# Patient Record
Sex: Female | Born: 1965 | Race: Black or African American | Hispanic: No | Marital: Single | Smoking: Never smoker
Health system: Southern US, Community
[De-identification: ages and names within clinical notes are randomized; demographics above are authoritative.]

## PROBLEM LIST (undated history)

## (undated) DIAGNOSIS — I10 Essential (primary) hypertension: Secondary | ICD-10-CM

## (undated) DIAGNOSIS — E7800 Pure hypercholesterolemia, unspecified: Secondary | ICD-10-CM

## (undated) HISTORY — PX: BREAST SURGERY: SHX581

## (undated) HISTORY — PX: BUNIONECTOMY: SHX129

## (undated) HISTORY — PX: OTHER SURGICAL HISTORY: SHX169

## (undated) HISTORY — PX: HYSTEROSCOPY: SHX211

## (undated) HISTORY — DX: Pure hypercholesterolemia, unspecified: E78.00

## (undated) HISTORY — PX: DILATION AND CURETTAGE OF UTERUS: SHX78

## (undated) HISTORY — DX: Essential (primary) hypertension: I10

---

## 1994-08-27 HISTORY — PX: BREAST EXCISIONAL BIOPSY: SUR124

## 1998-10-20 ENCOUNTER — Other Ambulatory Visit: Admission: RE | Admit: 1998-10-20 | Discharge: 1998-10-20 | Payer: Self-pay | Admitting: Gynecology

## 1999-06-16 ENCOUNTER — Encounter: Payer: Self-pay | Admitting: Emergency Medicine

## 1999-06-16 ENCOUNTER — Emergency Department (HOSPITAL_COMMUNITY): Admission: EM | Admit: 1999-06-16 | Discharge: 1999-06-16 | Payer: Self-pay | Admitting: Emergency Medicine

## 1999-11-15 ENCOUNTER — Other Ambulatory Visit: Admission: RE | Admit: 1999-11-15 | Discharge: 1999-11-15 | Payer: Self-pay | Admitting: Gynecology

## 2000-09-11 ENCOUNTER — Encounter (INDEPENDENT_AMBULATORY_CARE_PROVIDER_SITE_OTHER): Payer: Self-pay | Admitting: Specialist

## 2000-09-11 ENCOUNTER — Ambulatory Visit (HOSPITAL_BASED_OUTPATIENT_CLINIC_OR_DEPARTMENT_OTHER): Admission: RE | Admit: 2000-09-11 | Discharge: 2000-09-11 | Payer: Self-pay | Admitting: Surgery

## 2000-10-02 ENCOUNTER — Encounter: Admission: RE | Admit: 2000-10-02 | Discharge: 2000-10-02 | Payer: Self-pay | Admitting: Surgery

## 2000-10-02 ENCOUNTER — Encounter: Payer: Self-pay | Admitting: Surgery

## 2000-10-31 ENCOUNTER — Encounter (INDEPENDENT_AMBULATORY_CARE_PROVIDER_SITE_OTHER): Payer: Self-pay

## 2000-10-31 ENCOUNTER — Ambulatory Visit (HOSPITAL_COMMUNITY): Admission: RE | Admit: 2000-10-31 | Discharge: 2000-10-31 | Payer: Self-pay | Admitting: Gynecology

## 2000-11-11 ENCOUNTER — Encounter: Admission: RE | Admit: 2000-11-11 | Discharge: 2001-02-09 | Payer: Self-pay | Admitting: Internal Medicine

## 2000-11-14 ENCOUNTER — Other Ambulatory Visit: Admission: RE | Admit: 2000-11-14 | Discharge: 2000-11-14 | Payer: Self-pay | Admitting: Gynecology

## 2001-12-29 ENCOUNTER — Encounter: Admission: RE | Admit: 2001-12-29 | Discharge: 2001-12-29 | Payer: Self-pay | Admitting: Surgery

## 2001-12-29 ENCOUNTER — Encounter: Payer: Self-pay | Admitting: Surgery

## 2001-12-31 ENCOUNTER — Other Ambulatory Visit: Admission: RE | Admit: 2001-12-31 | Discharge: 2001-12-31 | Payer: Self-pay | Admitting: Gynecology

## 2003-02-02 ENCOUNTER — Encounter: Payer: Self-pay | Admitting: Internal Medicine

## 2003-02-02 ENCOUNTER — Encounter: Admission: RE | Admit: 2003-02-02 | Discharge: 2003-02-02 | Payer: Self-pay | Admitting: Internal Medicine

## 2003-03-03 ENCOUNTER — Encounter: Admission: RE | Admit: 2003-03-03 | Discharge: 2003-03-03 | Payer: Self-pay | Admitting: Surgery

## 2003-03-03 ENCOUNTER — Encounter: Payer: Self-pay | Admitting: Surgery

## 2003-03-17 ENCOUNTER — Other Ambulatory Visit: Admission: RE | Admit: 2003-03-17 | Discharge: 2003-03-17 | Payer: Self-pay | Admitting: Gynecology

## 2004-03-08 ENCOUNTER — Encounter: Admission: RE | Admit: 2004-03-08 | Discharge: 2004-03-08 | Payer: Self-pay | Admitting: Surgery

## 2004-11-22 ENCOUNTER — Other Ambulatory Visit: Admission: RE | Admit: 2004-11-22 | Discharge: 2004-11-22 | Payer: Self-pay | Admitting: Gynecology

## 2005-02-06 ENCOUNTER — Ambulatory Visit (HOSPITAL_COMMUNITY): Admission: RE | Admit: 2005-02-06 | Discharge: 2005-02-06 | Payer: Self-pay | Admitting: Orthopaedic Surgery

## 2005-02-06 ENCOUNTER — Ambulatory Visit (HOSPITAL_BASED_OUTPATIENT_CLINIC_OR_DEPARTMENT_OTHER): Admission: RE | Admit: 2005-02-06 | Discharge: 2005-02-06 | Payer: Self-pay | Admitting: Orthopaedic Surgery

## 2005-07-13 ENCOUNTER — Encounter: Admission: RE | Admit: 2005-07-13 | Discharge: 2005-07-13 | Payer: Self-pay | Admitting: Gynecology

## 2005-12-10 ENCOUNTER — Other Ambulatory Visit: Admission: RE | Admit: 2005-12-10 | Discharge: 2005-12-10 | Payer: Self-pay | Admitting: Gynecology

## 2006-06-17 ENCOUNTER — Other Ambulatory Visit: Admission: RE | Admit: 2006-06-17 | Discharge: 2006-06-17 | Payer: Self-pay | Admitting: Gynecology

## 2006-08-01 ENCOUNTER — Encounter: Admission: RE | Admit: 2006-08-01 | Discharge: 2006-08-01 | Payer: Self-pay | Admitting: Gynecology

## 2006-12-12 ENCOUNTER — Other Ambulatory Visit: Admission: RE | Admit: 2006-12-12 | Discharge: 2006-12-12 | Payer: Self-pay | Admitting: Gynecology

## 2007-09-24 ENCOUNTER — Encounter: Admission: RE | Admit: 2007-09-24 | Discharge: 2007-09-24 | Payer: Self-pay | Admitting: Gynecology

## 2008-01-14 ENCOUNTER — Other Ambulatory Visit: Admission: RE | Admit: 2008-01-14 | Discharge: 2008-01-14 | Payer: Self-pay | Admitting: Gynecology

## 2008-11-10 ENCOUNTER — Encounter: Admission: RE | Admit: 2008-11-10 | Discharge: 2008-11-10 | Payer: Self-pay | Admitting: Gynecology

## 2009-01-14 ENCOUNTER — Ambulatory Visit: Payer: Self-pay | Admitting: Gynecology

## 2009-01-14 ENCOUNTER — Other Ambulatory Visit: Admission: RE | Admit: 2009-01-14 | Discharge: 2009-01-14 | Payer: Self-pay | Admitting: Gynecology

## 2009-01-14 ENCOUNTER — Encounter: Payer: Self-pay | Admitting: Gynecology

## 2009-12-15 ENCOUNTER — Encounter: Admission: RE | Admit: 2009-12-15 | Discharge: 2009-12-15 | Payer: Self-pay | Admitting: Gynecology

## 2010-07-19 ENCOUNTER — Other Ambulatory Visit: Admission: RE | Admit: 2010-07-19 | Discharge: 2010-07-19 | Payer: Self-pay | Admitting: Gynecology

## 2010-07-19 ENCOUNTER — Ambulatory Visit: Payer: Self-pay | Admitting: Gynecology
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
## 2011-01-12 NOTE — H&P (Signed)
[facility]  Patient:    Mercedes Morton, Mercedes Morton                         MRN: [id number] Adm. Date:  10/31/00 Attending:  Marcial Pacas P. Fontaine, M.D.                         History and Physical  CHIEF COMPLAINT:              Irregular bleeding.  HISTORY OF PRESENT ILLNESS:   A 45 year old G0 female with history of irregular vaginal bleeding since November.  Patient has a history of irregular cycles in the past consistent with an ovulatory dysfunction and patient was evaluated with ultrasound and tentative endometrial sampling beginning a Provera withdrawal plan.  Patient was found at the time of her sonohysterogram, to have a diffusely thickened endometrial wall, irregular in appearance with tiny cystic foci throughout the posterior uterine lining.  A biopsy was postponed at that time feeling that she needed a more complete sampling to include D&C, as well as visual inspection to rule out true polyp formation.  Patient is admitted at this time for hysteroscopy/D&C.  PAST MEDICAL HISTORY:         Significant for high blood pressure.  PAST SURGICAL HISTORY:        Breast biopsy.  ALLERGIES:                    None.  CURRENT MEDICATIONS:          Zestoretic, vitamins, calcium.  REVIEW OF SYSTEMS:            Noncontributory.  FAMILY HISTORY:               Noncontributory.  SOCIAL HISTORY:               Without cigarette or alcohol use.  ADMISSION PHYSICAL EXAMINATION:  VITAL SIGNS:                  Per anesthesia intake.  GENERAL:                      Per anesthesia intake.  PELVIC:                       External, BUS, vagina normal.  Cervix grossly normal.  Uterus overall normal to bimanual although limited by abdominal girth.  Adnexa without gross masses or tenderness.  ASSESSMENT:                   A 45 year old G0, history of oligo-ovulatory cycles most recently with bleeding on and off with ultrasound suggestive of a thickened endometrium, rule  out hyperplasia for hysteroscopy/D&C.  I reviewed the issues of hysteroscopy/D&C to include the procedure in detail with the patient.  I discussed the risks to include bleeding, transfusion, infection, uterine perforation, damage to internal organs necessitating major exploratory ______ surgeries, and future ______ surgeries all of which were understood. The patients questions were answered to her satisfaction and she is ready to proceed with surgery. DD:  10/28/00 TD:  10/28/00 Job: [id number]

## 2011-01-12 NOTE — Op Note (Signed)
Mercedes Morton, Mercedes Morton                ACCOUNT NO.:  [id number]   MEDICAL RECORD NO.:  [id number]          PATIENT TYPE:  AMB   LOCATION:  DSC                          FACILITY:  [facility]   PHYSICIAN:  Lubertha Basque. Dalldorf, M.D.DATE OF BIRTH:  01-09-1966   DATE OF PROCEDURE:  02/06/2005  DATE OF DISCHARGE:                                 OPERATIVE REPORT   PREOPERATIVE DIAGNOSIS:  Right foot hallux valgus.   POSTOPERATIVE DIAGNOSIS:  Right foot hallux valgus.   PROCEDURE:  Right foot chevron osteotomy.   ANESTHESIA:  General.   ATTENDING SURGEON:  Lubertha Basque. Jerl Santos, M.D.   ASSISTANT:  Lindwood Qua, P.A.   INDICATIONS FOR PROCEDURE:  The patient is a 45 year old woman with long  history of painful bunions. We have been following her for about three years  and have tried various shoe modifications and anti-inflammatories.  Unfortunately, she persists with pain and difficulty finding the acceptable  shoe wear. She is offered an operation to correct this problem. Bunionectomy  with chevron osteotomy was described. Informed operative consent was  obtained after discussing possible complications of reaction to anesthesia,  infection, neurovascular injury.   DESCRIPTION OF PROCEDURE:  The patient was taken to the operating suite  where general anesthetic was applied without difficulty. She was positioned  supine and prepped and draped in normal sterile fashion. After  administration of preop IV antibiotics, the right leg was elevated,  exsanguinated, tourniquet inflated about her calf. A dorsomedial incision  was made over the first MTP joint with dissection down to the capsule. A V-Y  incision was made through the capsule to expose a very prominent bunion.  This was excised parallel with the medial border of the foot with an  oscillating saw. I then performed a chevron osteotomy cut in the metatarsal  head and shifted her distal portion in a lateral direction about 7 or 8 mm.  This was  then pinned with an Orthosorb pin. We then used a saw to cut off  the consequent prominence at the osteotomy site. Fluoroscopy was used to  confirm adequate correction of her deformity and I read these views myself  to make appropriate intraoperative decisions.  I then made an incision in  the first interspace and released the abductor at this area. The tourniquet  was deflated and a small amount bleeding was easily controlled Bovie  cautery. The toe became pink and warm and she had good refill of the tip.  The wound was irrigated followed by reapproximation of capsular tissues with  Vicryl. Skin was then reapproximated with nylon at both incisions.  Some  Marcaine was injected about the incision site followed by Adaptic and a dry  gauze dressing with a loose Ace wrap. Estimated blood loss and  intraoperative fluids can be obtained from anesthesia records as can  accurate tourniquet time.   DISPOSITION:  The patient was extubated in the operating room taken to  recovery in stable addition. Plans were for her to go home the same day and  follow up in the office in less than a week.  I  will contact by phone  tonight.       PGD/MEDQ  D:  02/06/2005  T:  02/06/2005  Job:  [id number]

## 2011-01-12 NOTE — Op Note (Signed)
[facility]  Patient:    Mercedes Morton, Mercedes Morton                         MRN: [id number] Proc. Date: 10/31/00 Attending:  Marcial Pacas P. Fontaine, M.D.                           Operative Report  PREOPERATIVE DIAGNOSIS:       Irregular bleeding, rule out polyps.  POSTOPERATIVE DIAGNOSIS:      Irregular bleeding, rule out polyps.  OPERATION:                    Hysteroscopy dilatation and curettage.  SURGEON:                      Timothy P. Fontaine, M.D.  ANESTHESIA:                   General.  COMPLICATIONS:                None.  ESTIMATED BLOOD LOSS:         Minimal  SORBITOL DISCREPANCY:         Minimal.  SPECIMENS:                    1. Endometrial polyps.                               2. Endometrial curetting.  FINDINGS:                     Endometrial cavity with undulations of polypoid endometrium.  No distinctive individual polyps identified.  Hysteroscopy otherwise normal.  DESCRIPTION OF PROCEDURE:     The patient was taken to the operating room and underwent anesthesia.  She was placed in the low dorsolithotomy position, received a vaginal perineal preparation with Betadine solution and then Foley catheterization by the nursing personnel.  The patient was draped in the usual fashion.  The cervix was visualized and the anterior lip of the cervix grasped with a single-tooth tenaculum.  The cervix was gently dilated to admit the diagnostic hysteroscope, hysteroscopy was performed with findings of multiple polypoid type endometrial projections, although no true polyps seen.  The first pass was made with polyp forceps.  Multiple passes removing multiple polypoid type fragments, and then subsequently a sharp curettage was performed.  The specimens were sent separately.  Repeat hysteroscopy showed good distension and empty cavity without remaining growths.  The instruments were then removed.  Adequate hemostasis was visualized.  The patient  was placed in the supine position and awakened without difficulty and taken to the recovery room in good condition. DD:  10/31/00 TD:  10/31/00 Job: [id number]

## 2011-02-08 ENCOUNTER — Other Ambulatory Visit: Payer: Self-pay | Admitting: Gynecology

## 2011-02-08 DIAGNOSIS — Z1231 Encounter for screening mammogram for malignant neoplasm of breast: Secondary | ICD-10-CM

## 2011-02-16 ENCOUNTER — Ambulatory Visit
Admission: RE | Admit: 2011-02-16 | Discharge: 2011-02-16 | Disposition: A | Discharge status: Home / Self Care | Payer: BC Managed Care – PPO | Source: Ambulatory Visit | Attending: Gynecology | Admitting: Gynecology

## 2011-02-16 DIAGNOSIS — Z1231 Encounter for screening mammogram for malignant neoplasm of breast: Secondary | ICD-10-CM

## 2011-08-07 ENCOUNTER — Encounter: Payer: Self-pay | Admitting: Gynecology

## 2011-08-07 DIAGNOSIS — I10 Essential (primary) hypertension: Secondary | ICD-10-CM | POA: Insufficient documentation

## 2011-08-07 DIAGNOSIS — N8500 Endometrial hyperplasia, unspecified: Secondary | ICD-10-CM | POA: Insufficient documentation

## 2011-08-13 ENCOUNTER — Encounter: Payer: Self-pay | Admitting: Gynecology

## 2011-08-13 ENCOUNTER — Ambulatory Visit (INDEPENDENT_AMBULATORY_CARE_PROVIDER_SITE_OTHER): Payer: BC Managed Care – PPO | Admitting: Gynecology

## 2011-08-13 VITALS — BP 130/78 | Ht 70.0 in | Wt 308.0 lb

## 2011-08-13 DIAGNOSIS — E7800 Pure hypercholesterolemia, unspecified: Secondary | ICD-10-CM | POA: Insufficient documentation

## 2011-08-13 DIAGNOSIS — Z01419 Encounter for gynecological examination (general) (routine) without abnormal findings: Secondary | ICD-10-CM

## 2011-08-13 DIAGNOSIS — Z23 Encounter for immunization: Secondary | ICD-10-CM

## 2011-08-13 NOTE — Progress Notes (Signed)
Mercedes Morton 1965-11-12 [id number]        45 y.o.  for annual exam.  Doing well with regular menses. Vasectomy birth control.  Past medical history,surgical history, medications, allergies, family history and social history were all reviewed and documented in the EPIC chart. ROS:  Was performed and pertinent positives and negatives are included in the history.  Exam: chaperone present Filed Vitals:   08/13/11 1515  BP: 130/78   General appearance  Normal Skin grossly normal Head/Neck normal with no cervical or supraclavicular adenopathy thyroid normal Lungs  clear Cardiac RR, without RMG Abdominal  soft, nontender, without masses, organomegaly or hernia Breasts  examined lying and sitting without masses, retractions, discharge or axillary adenopathy. Pelvic  Ext/BUS/vagina  normal   Cervix  normal    Uterus  axial, normal size, shape and contour, midline and mobile nontender   Adnexa  Without masses or tenderness    Anus and perineum  normal   Rectovaginal  normal sphincter tone without palpated masses or tenderness.    Assessment/Plan:  45 y.o. female for annual exam.  Doing well with regular menses. Vasectomy birth control. No Pap smear was done today. She has no history of abnormal Pap smears before and has multiple normal records in her chart the last one in 2011. We'll plan on less frequent screening interval per current guidelines at every 3 years.  SBE monthly reviewed. Had mammography in October 2012 and will continue with annual mammography. No blood work was done today as it is done through her primary who she actively sees and who follows her for her medical issues.  Assuming she continues well from a gynecologic standpoint then she will see [name] in a year, sooner as needed.    Dara Lords MD, 3:38 PM 08/13/2011

## 2011-08-13 NOTE — Patient Instructions (Signed)
Follow up in one year for gynecologic annual checkup.

## 2012-02-14 ENCOUNTER — Other Ambulatory Visit: Payer: Self-pay | Admitting: Gynecology

## 2012-02-14 DIAGNOSIS — Z1231 Encounter for screening mammogram for malignant neoplasm of breast: Secondary | ICD-10-CM

## 2012-02-18 ENCOUNTER — Ambulatory Visit
Admission: RE | Admit: 2012-02-18 | Discharge: 2012-02-18 | Disposition: A | Discharge status: Home / Self Care | Payer: BC Managed Care – PPO | Source: Ambulatory Visit | Attending: Gynecology | Admitting: Gynecology

## 2012-02-18 DIAGNOSIS — Z1231 Encounter for screening mammogram for malignant neoplasm of breast: Secondary | ICD-10-CM

## 2012-08-13 ENCOUNTER — Encounter: Payer: Self-pay | Admitting: Gynecology

## 2012-08-13 ENCOUNTER — Ambulatory Visit (INDEPENDENT_AMBULATORY_CARE_PROVIDER_SITE_OTHER): Payer: BC Managed Care – PPO | Admitting: Gynecology

## 2012-08-13 VITALS — BP 128/78 | Ht 69.5 in | Wt 295.0 lb

## 2012-08-13 DIAGNOSIS — Z01419 Encounter for gynecological examination (general) (routine) without abnormal findings: Secondary | ICD-10-CM

## 2012-08-13 NOTE — Progress Notes (Signed)
Mercedes Morton 18-Sep-1965 [id number]        46 y.o.  G1P0010 for annual exam.    Past medical history,surgical history, medications, allergies, family history and social history were all reviewed and documented in the EPIC chart. ROS:  Was performed and pertinent positives and negatives are included in the history.  Exam: Sherrilyn Rist assistant Filed Vitals:   08/13/12 0951  BP: 128/78  Height: 5' 9.5" (1.765 m)  Weight: 295 lb (133.811 kg)   General appearance  Normal Skin grossly normal Head/Neck normal with no cervical or supraclavicular adenopathy thyroid normal Lungs  clear Cardiac RR, without RMG Abdominal  soft, nontender, without masses, organomegaly or hernia Breasts  examined lying and sitting without masses, retractions, discharge or axillary adenopathy. Pelvic  Ext/BUS/vagina  normal   Cervix  normal   Uterus  axial, normal size, shape and contour, midline and mobile nontender   Adnexa  Without masses or tenderness    Anus and perineum  normal   Rectovaginal  normal sphincter tone without palpated masses or tenderness.    Assessment/Plan:  46 y.o. G27P0010 female for annual exam, regular menses, vasectomy birth control.   1. 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 Follow up biopsy 2004 benign. Regular monthly menses lasting 3-5 days. Continue to monitor. 2. Mammography 01/2012. Continue with annual mammography. SBE monthly reviewed. 3. Pap smear 06/2010. No Pap smear done today. No history of abnormal Pap smears. Plan repeat next year a 3 year interval. 4. Health maintenance. No blood work done as it is all done through her primary physician's office who she sees on a regular basis. Follow up one year, sooner as needed.    Dara Lords MD, 10:12 AM 08/13/2012

## 2012-08-13 NOTE — Patient Instructions (Signed)
Follow up in one year for annual exam 

## 2012-08-14 LAB — URINALYSIS W MICROSCOPIC + REFLEX CULTURE
Bilirubin Urine: NEGATIVE
Crystals: NONE SEEN
Nitrite: NEGATIVE
Specific Gravity, Urine: 1.012 (ref 1.005–1.030)
Squamous Epithelial / LPF: NONE SEEN
Urobilinogen, UA: 0.2 mg/dL (ref 0.0–1.0)

## 2013-02-18 ENCOUNTER — Other Ambulatory Visit: Payer: Self-pay

## 2013-02-18 DIAGNOSIS — Z1231 Encounter for screening mammogram for malignant neoplasm of breast: Secondary | ICD-10-CM

## 2013-03-13 ENCOUNTER — Ambulatory Visit: Payer: BC Managed Care – PPO

## 2013-03-31 ENCOUNTER — Ambulatory Visit: Payer: BC Managed Care – PPO

## 2013-05-14 ENCOUNTER — Ambulatory Visit
Admission: RE | Admit: 2013-05-14 | Discharge: 2013-05-14 | Disposition: A | Discharge status: Home / Self Care | Payer: BC Managed Care – PPO | Source: Ambulatory Visit

## 2013-05-14 DIAGNOSIS — Z1231 Encounter for screening mammogram for malignant neoplasm of breast: Secondary | ICD-10-CM

## 2013-09-09 ENCOUNTER — Encounter: Payer: Self-pay | Admitting: Gynecology

## 2013-10-08 ENCOUNTER — Encounter: Payer: Self-pay | Admitting: Gynecology

## 2013-10-08 ENCOUNTER — Ambulatory Visit (INDEPENDENT_AMBULATORY_CARE_PROVIDER_SITE_OTHER): Payer: BC Managed Care – PPO | Admitting: Gynecology

## 2013-10-08 ENCOUNTER — Other Ambulatory Visit (HOSPITAL_COMMUNITY)
Admission: RE | Admit: 2013-10-08 | Discharge: 2013-10-08 | Disposition: A | Discharge status: Home / Self Care | Payer: BC Managed Care – PPO | Source: Ambulatory Visit | Attending: Gynecology | Admitting: Gynecology

## 2013-10-08 VITALS — BP 124/80 | Ht 69.5 in | Wt 310.0 lb

## 2013-10-08 DIAGNOSIS — N898 Other specified noninflammatory disorders of vagina: Secondary | ICD-10-CM

## 2013-10-08 DIAGNOSIS — Z01419 Encounter for gynecological examination (general) (routine) without abnormal findings: Secondary | ICD-10-CM

## 2013-10-08 DIAGNOSIS — Z1151 Encounter for screening for human papillomavirus (HPV): Secondary | ICD-10-CM | POA: Insufficient documentation

## 2013-10-08 LAB — WET PREP FOR TRICH, YEAST, CLUE: TRICH WET PREP: NONE SEEN

## 2013-10-08 MED ORDER — NYSTATIN-TRIAMCINOLONE 100000-0.1 UNIT/GM-% EX OINT: 1.0000 "application " | TOPICAL_OINTMENT | Freq: Two times a day (BID) | CUTANEOUS | Status: DC

## 2013-10-08 MED ORDER — METRONIDAZOLE 500 MG PO TABS: 500.0000 mg | ORAL_TABLET | Freq: Two times a day (BID) | ORAL | Status: DC

## 2013-10-08 MED ORDER — FLUCONAZOLE 200 MG PO TABS: 200.0000 mg | ORAL_TABLET | Freq: Every day | ORAL | Status: DC

## 2013-10-08 NOTE — Progress Notes (Signed)
Mercedes HughsWanda F Morton [birth year] [id number]        48 y.o.  G1P0010 for annual exam.  Several issues noted below.  Past medical history,surgical history, problem list, medications, allergies, family history and social history were all reviewed and documented in the EPIC chart.  ROS:  Performed and pertinent positives and negatives are included in the history, assessment and plan .  Exam: Kim assistant Filed Vitals:   10/08/13 1153  BP: 124/80  Height: 5' 9.5" (1.765 m)  Weight: 310 lb (140.615 kg)   General appearance  Normal Skin grossly normal Head/Neck normal with no cervical or supraclavicular adenopathy thyroid normal Lungs  clear Cardiac RR, without RMG Abdominal  soft, nontender, without masses, organomegaly or hernia Breasts  examined lying and sitting without masses, retractions, discharge or axillary adenopathy. Pelvic  Ext/BUS/vagina  inflammation with skin cracking midline perineal body. Thick white cottage cheese discharge  Cervix  Normal. Pap/HPV  Uterus  anteverted, grossly normal size, shape and contour, midline and mobile nontender. Exam limited by abdominal girth   Adnexa  Without gross masses or tenderness    Anus and perineum  Normal   Rectovaginal  Normal sphincter tone without palpated masses or tenderness.    Assessment/Plan:  48 y.o. 571P0010 female for annual exam regular menses, vasectomy birth control.   1. Vaginal discharge/irritation. Patient notes over the past several months a slight discharge with irritation that comes and goes throughout the month. Exam and wet prep consistent with yeast and bacterial vaginitis. Will treat with Flagyl 500 mg twice a day x7 days, alcohol avoidance reviewed. Diflucan 200 mg daily x3 days and Mytrex cream externally twice a day. Followup if symptoms persist, worsen or recur. 2. 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 Followup biopsy 2004 benign. Has been having regular monthly menses since. Continue to  monitor. 3. Mammography 04/2013. Continued annual mammography. 4. Pap smear 2011. Pap/HPV today. No history of abnormal Pap smears previously. Plan repeat at 3-5 year interval assuming negative. 5. Health maintenance. No blood work done as she reports this all done through her primary physician's office who follows her for her medical issues. Followup one year, sooner as needed.   Note: This document was prepared with digital dictation and possible smart phrase technology. Any transcriptional errors that result from this process are unintentional.   Dara LordsFONTAINE,Aubrianne Molyneux P MD, 12:22 PM 10/08/2013

## 2013-10-08 NOTE — Addendum Note (Signed)
Addended by: Dayna BarkerGARDNER, Kizzy Olafson K on: 10/08/2013 02:36 PM   Modules accepted: Orders

## 2013-10-08 NOTE — Addendum Note (Signed)
Addended by: Dayna BarkerGARDNER, Dorissa Stinnette K on: 10/08/2013 12:28 PM   Modules accepted: Orders

## 2013-10-08 NOTE — Patient Instructions (Signed)
Take Flagyl (metronidazole) medication twice daily for 7 days. Avoid alcohol while taking. Take the Diflucan pill daily for 3 days. Use the prescribed cream externally twice daily for irritation. Followup in one year for annual exam.

## 2013-10-09 LAB — URINALYSIS W MICROSCOPIC + REFLEX CULTURE
Bacteria, UA: NONE SEEN
Bilirubin Urine: NEGATIVE
Casts: NONE SEEN
Crystals: NONE SEEN
Glucose, UA: NEGATIVE mg/dL
HGB URINE DIPSTICK: NEGATIVE
Ketones, ur: NEGATIVE mg/dL
LEUKOCYTES UA: NEGATIVE
NITRITE: NEGATIVE
PROTEIN: NEGATIVE mg/dL
SQUAMOUS EPITHELIAL / LPF: NONE SEEN
Specific Gravity, Urine: 1.021 (ref 1.005–1.030)
UROBILINOGEN UA: 0.2 mg/dL (ref 0.0–1.0)
pH: 6 (ref 5.0–8.0)

## 2014-05-26 ENCOUNTER — Ambulatory Visit: Payer: BC Managed Care – PPO | Admitting: Podiatrist

## 2014-06-09 ENCOUNTER — Encounter: Payer: Self-pay | Admitting: Podiatrist

## 2014-06-09 ENCOUNTER — Ambulatory Visit (INDEPENDENT_AMBULATORY_CARE_PROVIDER_SITE_OTHER): Payer: BC Managed Care – PPO | Admitting: Podiatrist

## 2014-06-09 VITALS — BP 149/92 | HR 87 | Resp 16 | Ht 69.0 in | Wt 309.0 lb

## 2014-06-09 DIAGNOSIS — E119 Type 2 diabetes mellitus without complications: Secondary | ICD-10-CM

## 2014-06-09 DIAGNOSIS — B353 Tinea pedis: Secondary | ICD-10-CM

## 2014-06-09 MED ORDER — NAFTIFINE HCL 2 % EX CREA: 1.0000 "application " | TOPICAL_CREAM | CUTANEOUS | Status: DC

## 2014-06-09 NOTE — Progress Notes (Signed)
   Subjective:    Patient ID: Mercedes Morton, female    DOB: Oct 11, 1965, 48 y.o.   MRN: [id number]  HPI Comments: "My doctor sent me here just to have them checked"  Patient states that she is diabetic and PCP recommended foot exam. She was having some flaky skin on right foot but has since cleared. She has no numbness or tingling in feet. 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 She says her sugars have been a little high lately. Her last A1C was 8.2.  Diabetes      Review of Systems  Constitutional: Positive for unexpected weight change.  All other systems reviewed and are negative.      Objective:   Physical Exam GENERAL APPEARANCE: Alert, conversant. Appropriately groomed. No acute distress.  VASCULAR: Pedal pulses palpable at 2/4 DP and PT bilateral.  Capillary refill time is immediate to all digits,  Proximal to distal cooling it warm to warm.  Digital hair growth is present bilateral  NEUROLOGIC: sensation is intact epicritically and protectively to 5.07 monofilament at 5/5 sites bilateral.  Light touch is intact bilateral, vibratory sensation intact bilateral, achilles tendon reflex is intact bilateral.  MUSCULOSKELETAL: moderate bunion deformity is present on the right foot.  Otherwise acceptable muscle strength, tone and stability bilateral.  Intrinsic muscluature intact bilateral.   DERMATOLOGIC: plantar skin of feet is scaly and xerotic.  She relates small pustules pop up when she uses regular lotion.  mocassin like distribution of the xerosis consistent with tinea pedis is present.  Nails are darkend but asymptomatic.  No ingrown deformity is present.    Assessment & Plan:  Diabetes, bunion, tinea pedis  Plan:  Recommended naftin cream for the tinea pedis.  rx was called into her pharmacy.  Discussed the bunion and conservative treatments including shoe changes and spacers-  Discussed surgery if it became painful and impacted daily walking.  Otherwise she looks great.  Dispensed written  material for diabetic foot checks and care.  Will call if any questions arise.

## 2014-06-09 NOTE — Patient Instructions (Signed)
Diabetes and Foot Care Diabetes may cause you to have problems because of poor blood supply (circulation) to your feet and legs. This may cause the skin on your feet to become thinner, break easier, and heal more slowly. Your skin may become dry, and the skin may peel and crack. You may also have nerve damage in your legs and feet causing decreased feeling in them. You may not notice minor injuries to your feet that could lead to infections or more serious problems. Taking care of your feet is one of the most important things you can do for yourself.  HOME CARE INSTRUCTIONS  Wear shoes at all times, even in the house. Do not go barefoot. Bare feet are easily injured.  Check your feet daily for blisters, cuts, and redness. If you cannot see the bottom of your feet, use a mirror or ask someone for help.  Wash your feet with warm water (do not use hot water) and mild soap. Then pat your feet and the areas between your toes until they are completely dry. Do not soak your feet as this can dry your skin.  Apply a moisturizing lotion or petroleum jelly (that does not contain alcohol and is unscented) to the skin on your feet and to dry, brittle toenails. Do not apply lotion between your toes.  Trim your toenails straight across. Do not dig under them or around the cuticle. File the edges of your nails with an emery board or nail file.  Do not cut corns or calluses or try to remove them with medicine.  Wear clean socks or stockings every day. Make sure they are not too tight. Do not wear knee-high stockings since they may decrease blood flow to your legs.  Wear shoes that fit properly and have enough cushioning. To break in new shoes, wear them for just a few hours a day. This prevents you from injuring your feet. Always look in your shoes before you put them on to be sure there are no objects inside.  Do not cross your legs. This may decrease the blood flow to your feet.  If you find a minor scrape,  cut, or break in the skin on your feet, keep it and the skin around it clean and dry. These areas may be cleansed with mild soap and water. Do not cleanse the area with peroxide, alcohol, or iodine.  When you remove an adhesive bandage, be sure not to damage the skin around it.  If you have a wound, look at it several times a day to make sure it is healing.  Do not use heating pads or hot water bottles. They may burn your skin. If you have lost feeling in your feet or legs, you may not know it is happening until it is too late.  Make sure your health care provider performs a complete foot exam at least annually or more often if you have foot problems. Report any cuts, sores, or bruises to your health care provider immediately. SEEK MEDICAL CARE IF:   You have an injury that is not healing.  You have cuts or breaks in the skin.  You have an ingrown nail.  You notice redness on your legs or feet.  You feel burning or tingling in your legs or feet.  You have pain or cramps in your legs and feet.  Your legs or feet are numb.  Your feet always feel cold. SEEK IMMEDIATE MEDICAL CARE IF:   There is increasing redness,   swelling, or pain in or around a wound.  There is a red line that goes up your leg.  Pus is coming from a wound.  You develop a fever or as directed by your health care provider.  You notice a bad smell coming from an ulcer or wound. Document Released: 08/10/2000 Document Revised: 04/15/2013 Document Reviewed: 01/20/2013 Exit[employer] Patient Information 2015 [employer]. This information is not intended to replace advice given to you by your health care provider. Make sure you discuss any questions you have with your health care provider.  

## 2014-06-22 ENCOUNTER — Other Ambulatory Visit: Payer: Self-pay

## 2014-06-22 DIAGNOSIS — Z1231 Encounter for screening mammogram for malignant neoplasm of breast: Secondary | ICD-10-CM

## 2014-06-28 ENCOUNTER — Encounter: Payer: Self-pay | Admitting: Podiatrist

## 2014-07-12 ENCOUNTER — Ambulatory Visit
Admission: RE | Admit: 2014-07-12 | Discharge: 2014-07-12 | Disposition: A | Discharge status: Home / Self Care | Payer: BC Managed Care – PPO | Source: Ambulatory Visit

## 2014-07-12 ENCOUNTER — Encounter (INDEPENDENT_AMBULATORY_CARE_PROVIDER_SITE_OTHER): Payer: Self-pay

## 2014-07-12 DIAGNOSIS — Z1231 Encounter for screening mammogram for malignant neoplasm of breast: Secondary | ICD-10-CM

## 2014-12-01 ENCOUNTER — Encounter: Payer: Self-pay | Admitting: Gynecology

## 2014-12-01 ENCOUNTER — Ambulatory Visit (INDEPENDENT_AMBULATORY_CARE_PROVIDER_SITE_OTHER): Payer: BC Managed Care – PPO | Admitting: Gynecology

## 2014-12-01 VITALS — BP 130/84 | Ht 70.0 in | Wt 306.0 lb

## 2014-12-01 DIAGNOSIS — Z01419 Encounter for gynecological examination (general) (routine) without abnormal findings: Secondary | ICD-10-CM

## 2014-12-01 NOTE — Progress Notes (Signed)
Mercedes Morton Apr 16, 1966 [id number]        49 y.o.  G1P0010 for annual exam.  Doing well without complaints.  Past medical history,surgical history, problem list, medications, allergies, family history and social history were all reviewed and documented as reviewed in the EPIC chart.  ROS:  Performed with pertinent positives and negatives included in the history, assessment and plan.   Additional significant findings :  none   Exam: [facility] personassistant Filed Vitals:   12/01/14 0921  BP: 130/84  Height: 5\' 10"  (1.778 m)  Weight: 306 lb (138.801 kg)   General appearance:  Normal affect, orientation and appearance. Skin: Grossly normal HEENT: Without gross lesions.  No cervical or supraclavicular adenopathy. Thyroid normal.  Lungs:  Clear without wheezing, rales or rhonchi Cardiac: RR, without RMG Abdominal:  Soft, nontender, without masses, guarding, rebound, organomegaly or hernia Breasts:  Examined lying and sitting without masses, retractions, discharge or axillary adenopathy. Pelvic:  Ext/BUS/vagina normal  Cervix normal  Uterus grossly normal but difficult to palpate., normal size, shape and contour, midline and mobile nontender   Adnexa  Without gross masses or tenderness    Anus and perineum  Normal   Rectovaginal  Normal sphincter tone without palpated masses or tenderness.    Assessment/Plan:  49 y.o. 301P0010 female for annual exam with regular menses, vasectomy birth control.   1. 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 Follow up biopsy 2004. Having regular monthly menses. No menopausal symptoms. Keep menstrual calendar as long as regular will follow. Possible onset of menopause is coming year reviewed. If less frequent regular menses will monitor. If prolonged recovery typical bleeding she knows to follow up for evaluation. 2. Pap smear/HPV 09/2013 negative.  No Pap smear done today. No history of abnormal Pap smears previously. Plan repeat Pap smear in 3-5  year interval per current screening guidelines. 3. Mammography 06/2014. Continue with annual mammography. SBE monthly reviewed. 4. Colonoscopy never. Will plan to schedule this coming year. 5. Health maintenance. No routine blood work done as patient has this done at her primary physician's office who follows her for her medical issues. Follow up in one year, sooner as needed.     Mercedes Morton,Mercedes Morton, 9:41 AM 12/01/2014

## 2014-12-01 NOTE — Patient Instructions (Signed)
You may obtain a copy of any labs that were done today by logging onto MyChart as outlined in the instructions provided with your AVS (after visit summary). The office will not call with normal lab results but certainly if there are any significant abnormalities then we will contact you.   Health Maintenance, Female A healthy lifestyle and preventative care can promote health and wellness.  Maintain regular health, dental, and eye exams.  Eat a healthy diet. Foods like vegetables, fruits, whole grains, low-fat dairy products, and lean protein foods contain the nutrients you need without too many calories. Decrease your intake of foods high in solid fats, added sugars, and salt. Get information about a proper diet from your caregiver, if necessary.  Regular physical exercise is one of the most important things you can do for your health. Most adults should get at least 150 minutes of moderate-intensity exercise (any activity that increases your heart rate and causes you to sweat) each week. In addition, most adults need muscle-strengthening exercises on 2 or more days a week.   Maintain a healthy weight. The body mass index (BMI) is a screening tool to identify possible weight problems. It provides an estimate of body fat based on height and weight. Your caregiver can help determine your BMI, and can help you achieve or maintain a healthy weight. For adults 20 years and older:  A BMI below 18.5 is considered underweight.  A BMI of 18.5 to 24.9 is normal.  A BMI of 25 to 29.9 is considered overweight.  A BMI of 30 and above is considered obese.  Maintain normal blood lipids and cholesterol by exercising and minimizing your intake of saturated fat. Eat a balanced diet with plenty of fruits and vegetables. Blood tests for lipids and cholesterol should begin at age 61 and be repeated every 5 years. If your lipid or cholesterol levels are high, you are over 50, or you are a high risk for heart  disease, you may need your cholesterol levels checked more frequently.Ongoing high lipid and cholesterol levels should be treated with medicines if diet and exercise are not effective.  If you smoke, find out from your caregiver how to quit. If you do not use tobacco, do not start.  Lung cancer screening is recommended for adults aged 33 80 years who are at high risk for developing lung cancer because of a history of smoking. Yearly low-dose computed tomography (CT) is recommended for people who have at least a 30-pack-year history of smoking and are a current smoker or have quit within the past 15 years. A pack year of smoking is smoking an average of 1 pack of cigarettes a day for 1 year (for example: 1 pack a day for 30 years or 2 packs a day for 15 years). Yearly screening should continue until the smoker has stopped smoking for at least 15 years. Yearly screening should also be stopped for people who develop a health problem that would prevent them from having lung cancer treatment.  If you are pregnant, do not drink alcohol. If you are breastfeeding, be very cautious about drinking alcohol. If you are not pregnant and choose to drink alcohol, do not exceed 1 drink per day. One drink is considered to be 12 ounces (355 mL) of beer, 5 ounces (148 mL) of wine, or 1.5 ounces (44 mL) of liquor.  Avoid use of street drugs. Do not share needles with anyone. Ask for help if you need support or instructions about stopping  the use of drugs.  High blood pressure causes heart disease and increases the risk of stroke. Blood pressure should be checked at least every 1 to 2 years. Ongoing high blood pressure should be treated with medicines, if weight loss and exercise are not effective.  If you are 59 to 50 years old, ask your caregiver if you should take aspirin to prevent strokes.  Diabetes screening involves taking a blood sample to check your fasting blood sugar level. This should be done once every 3  years, after age 91, if you are within normal weight and without risk factors for diabetes. Testing should be considered at a younger age or be carried out more frequently if you are overweight and have at least 1 risk factor for diabetes.  Breast cancer screening is essential preventative care for women. You should practice "breast self-awareness." This means understanding the normal appearance and feel of your breasts and may include breast self-examination. Any changes detected, no matter how small, should be reported to a caregiver. Women in their 66s and 30s should have a clinical breast exam (CBE) by a caregiver as part of a regular health exam every 1 to 3 years. After age 101, women should have a CBE every year. Starting at age 100, women should consider having a mammogram (breast X-ray) every year. Women who have a family history of breast cancer should talk to their caregiver about genetic screening. Women at a high risk of breast cancer should talk to their caregiver about having an MRI and a mammogram every year.  Breast cancer gene (BRCA)-related cancer risk assessment is recommended for women who have family members with BRCA-related cancers. BRCA-related cancers include breast, ovarian, tubal, and peritoneal cancers. Having family members with these cancers may be associated with an increased risk for harmful changes (mutations) in the breast cancer genes BRCA1 and BRCA2. Results of the assessment will determine the need for genetic counseling and BRCA1 and BRCA2 testing.  The Pap test is a screening test for cervical cancer. Women should have a Pap test starting at age 57. Between ages 25 and 35, Pap tests should be repeated every 2 years. Beginning at age 37, you should have a Pap test every 3 years as long as the past 3 Pap tests have been normal. If you had a hysterectomy for a problem that was not cancer or a condition that could lead to cancer, then you no longer need Pap tests. If you are  between ages 50 and 76, and you have had normal Pap tests going back 10 years, you no longer need Pap tests. If you have had past treatment for cervical cancer or a condition that could lead to cancer, you need Pap tests and screening for cancer for at least 20 years after your treatment. If Pap tests have been discontinued, risk factors (such as a new sexual partner) need to be reassessed to determine if screening should be resumed. Some women have medical problems that increase the chance of getting cervical cancer. In these cases, your caregiver may recommend more frequent screening and Pap tests.  The human papillomavirus (HPV) test is an additional test that may be used for cervical cancer screening. The HPV test looks for the virus that can cause the cell changes on the cervix. The cells collected during the Pap test can be tested for HPV. The HPV test could be used to screen women aged 44 years and older, and should be used in women of any age  who have unclear Pap test results. After the age of 55, women should have HPV testing at the same frequency as a Pap test.  Colorectal cancer can be detected and often prevented. Most routine colorectal cancer screening begins at the age of 44 and continues through age 20. However, your caregiver may recommend screening at an earlier age if you have risk factors for colon cancer. On a yearly basis, your caregiver may provide home test kits to check for hidden blood in the stool. Use of a small camera at the end of a tube, to directly examine the colon (sigmoidoscopy or colonoscopy), can detect the earliest forms of colorectal cancer. Talk to your caregiver about this at age 86, when routine screening begins. Direct examination of the colon should be repeated every 5 to 10 years through age 13, unless early forms of pre-cancerous polyps or small growths are found.  Hepatitis C blood testing is recommended for all people born from [birth year] through 1966 and any  individual with known risks for hepatitis C.  Practice safe sex. Use condoms and avoid high-risk sexual practices to reduce the spread of sexually transmitted infections (STIs). Sexually active women aged 36 and younger should be checked for Chlamydia, which is a common sexually transmitted infection. Older women with new or multiple partners should also be tested for Chlamydia. Testing for other STIs is recommended if you are sexually active and at increased risk.  Osteoporosis is a disease in which the bones lose minerals and strength with aging. This can result in serious bone fractures. The risk of osteoporosis can be identified using a bone density scan. Women ages 20 and over and women at risk for fractures or osteoporosis should discuss screening with their caregivers. Ask your caregiver whether you should be taking a calcium supplement or vitamin D to reduce the rate of osteoporosis.  Menopause can be associated with physical symptoms and risks. Hormone replacement therapy is available to decrease symptoms and risks. You should talk to your caregiver about whether hormone replacement therapy is right for you.  Use sunscreen. Apply sunscreen liberally and repeatedly throughout the day. You should seek shade when your shadow is shorter than you. Protect yourself by wearing long sleeves, pants, a wide-brimmed hat, and sunglasses year round, whenever you are outdoors.  Notify your caregiver of new moles or changes in moles, especially if there is a change in shape or color. Also notify your caregiver if a mole is larger than the size of a pencil eraser.  Stay current with your immunizations. Document Released: 02/26/2011 Document Revised: 12/08/2012 Document Reviewed: 02/26/2011 [facility] Patient Information 2014 [employer].

## 2014-12-02 LAB — URINALYSIS W MICROSCOPIC + REFLEX CULTURE
BACTERIA UA: NONE SEEN
Bilirubin Urine: NEGATIVE
CASTS: NONE SEEN
CRYSTALS: NONE SEEN
Hgb urine dipstick: NEGATIVE
Ketones, ur: NEGATIVE mg/dL
Leukocytes, UA: NEGATIVE
NITRITE: NEGATIVE
PROTEIN: NEGATIVE mg/dL
SPECIFIC GRAVITY, URINE: 1.023 (ref 1.005–1.030)
Squamous Epithelial / LPF: NONE SEEN
Urobilinogen, UA: 0.2 mg/dL (ref 0.0–1.0)
pH: 6.5 (ref 5.0–8.0)

## 2015-06-20 ENCOUNTER — Other Ambulatory Visit: Payer: Self-pay

## 2015-06-20 DIAGNOSIS — Z1231 Encounter for screening mammogram for malignant neoplasm of breast: Secondary | ICD-10-CM

## 2015-07-04 ENCOUNTER — Other Ambulatory Visit: Payer: Self-pay | Admitting: Gastroenterology

## 2015-07-19 ENCOUNTER — Ambulatory Visit
Admission: RE | Admit: 2015-07-19 | Discharge: 2015-07-19 | Disposition: A | Discharge status: Home / Self Care | Payer: BC Managed Care – PPO | Source: Ambulatory Visit

## 2015-07-19 DIAGNOSIS — Z1231 Encounter for screening mammogram for malignant neoplasm of breast: Secondary | ICD-10-CM

## 2015-12-19 ENCOUNTER — Encounter: Payer: Self-pay | Admitting: Gynecology

## 2015-12-19 ENCOUNTER — Ambulatory Visit (INDEPENDENT_AMBULATORY_CARE_PROVIDER_SITE_OTHER): Payer: BC Managed Care – PPO | Admitting: Gynecology

## 2015-12-19 VITALS — BP 130/80 | Ht 70.0 in | Wt 299.0 lb

## 2015-12-19 DIAGNOSIS — Z01419 Encounter for gynecological examination (general) (routine) without abnormal findings: Secondary | ICD-10-CM | POA: Diagnosis not present

## 2015-12-19 NOTE — Patient Instructions (Signed)

## 2015-12-19 NOTE — Progress Notes (Signed)
    Libby MawWanda Pellicane 03-08-1966 [id number]        50 y.o.  G1P0010  for annual exam.  Doing well.  Past medical history,surgical history, problem list, medications, allergies, family history and social history were all reviewed and documented as reviewed in the EPIC chart.  ROS:  Performed with pertinent positives and negatives included in the history, assessment and plan.   Additional significant findings :  none   Exam: Kennon PortelaKim Gardner assistant Filed Vitals:   12/19/15 0808  BP: 130/80  Height: 5\' 10"  (1.778 m)  Weight: 299 lb (135.626 kg)   General appearance:  Normal affect, orientation and appearance. Skin: Grossly normal HEENT: Without gross lesions.  No cervical or supraclavicular adenopathy. Thyroid normal.  Lungs:  Clear without wheezing, rales or rhonchi Cardiac: RR, without RMG Abdominal:  Soft, nontender, without masses, guarding, rebound, organomegaly or hernia Breasts:  Examined lying and sitting without masses, retractions, discharge or axillary adenopathy. Pelvic:  Ext/BUS/vagina normal  Cervix normal  Uterus grossly normal size, midline and mobile nontender   Adnexa without gross masses or tenderness    Anus and perineum normal   Rectovaginal normal sphincter tone without palpated masses or tenderness.    Assessment/Plan:  50 y.o. 431P0010 female for annual exam with regular menses, vasectomy birth control.   1. Continues with regular monthly menses. Reviewed the perimenopausal time frame. Patient will keep menstrual calendar as long as regular or less than monthly but regular when they occur menses will monitor. If significant irregularity or atypical bleeding or significant menopausal symptoms and she'll follow up for further evaluation. 2. Decreased libido. Discussed the issues of decreased libido with aging. Treatment options to include testosterone or Addyi discussed. At this point patients not interested. Line mammography 06/2015. Continue with annual mammography  when due. SBE monthly reviewed. 3. Pap smear/HPV 09/2013. No Pap smear done today. No history of significant abnormal Pap smears. Plan repeat Pap smear at five-year interval per current screening guidelines. 4. Colonoscopy 2016. Repeat at their recommended interval. 5. Health maintenance. No routine lab work done as patient has this done elsewhere. Follow up 1 year, sooner as needed.   Dara LordsFONTAINE,Arlen Legendre P MD, 8:26 AM 12/19/2015

## 2016-06-27 ENCOUNTER — Other Ambulatory Visit: Payer: Self-pay | Admitting: Gynecology

## 2016-06-27 DIAGNOSIS — Z1231 Encounter for screening mammogram for malignant neoplasm of breast: Secondary | ICD-10-CM

## 2016-07-30 ENCOUNTER — Ambulatory Visit
Admission: RE | Admit: 2016-07-30 | Discharge: 2016-07-30 | Disposition: A | Discharge status: Home / Self Care | Payer: BC Managed Care – PPO | Source: Ambulatory Visit | Attending: Gynecology | Admitting: Gynecology

## 2016-07-30 DIAGNOSIS — Z1231 Encounter for screening mammogram for malignant neoplasm of breast: Secondary | ICD-10-CM

## 2016-12-24 ENCOUNTER — Ambulatory Visit (INDEPENDENT_AMBULATORY_CARE_PROVIDER_SITE_OTHER): Payer: BC Managed Care – PPO | Admitting: Gynecology

## 2016-12-24 ENCOUNTER — Encounter: Payer: Self-pay | Admitting: Gynecology

## 2016-12-24 VITALS — BP 114/70 | Ht 70.0 in | Wt 281.0 lb

## 2016-12-24 DIAGNOSIS — R3911 Hesitancy of micturition: Secondary | ICD-10-CM | POA: Diagnosis not present

## 2016-12-24 DIAGNOSIS — Z01419 Encounter for gynecological examination (general) (routine) without abnormal findings: Secondary | ICD-10-CM

## 2016-12-24 LAB — URINALYSIS W MICROSCOPIC + REFLEX CULTURE
BILIRUBIN URINE: NEGATIVE
Bacteria, UA: NONE SEEN [HPF]
Casts: NONE SEEN [LPF]
Crystals: NONE SEEN [HPF]
HGB URINE DIPSTICK: NEGATIVE
Ketones, ur: NEGATIVE
Leukocytes, UA: NEGATIVE
NITRITE: NEGATIVE
PROTEIN: NEGATIVE
RBC / HPF: NONE SEEN RBC/HPF (ref <=2)
Specific Gravity, Urine: 1.023 (ref 1.001–1.035)
WBC, UA: NONE SEEN WBC/HPF (ref <=5)
YEAST: NONE SEEN [HPF]
pH: 7 (ref 5.0–8.0)

## 2016-12-24 NOTE — Patient Instructions (Signed)

## 2016-12-24 NOTE — Progress Notes (Signed)
    Mercedes Morton 1965-12-18 [id number]        51 y.o.  G1P0010 for annual exam.   Past medical history,surgical history, problem list, medications, allergies, family history and social history were all reviewed and documented as reviewed in the EPIC chart.  ROS:  Performed with pertinent positives and negatives included in the history, assessment and plan.   Additional significant findings :  None   Exam: [name] assistant Vitals:   12/24/16 0855  BP: 114/70  Weight: 281 lb (127.5 kg)  Height:  (1.778 m)   Body mass index is 40.32 kg/m.  General appearance:  Normal affect, orientation and appearance. Skin: Grossly normal HEENT: Without gross lesions.  No cervical or supraclavicular adenopathy. Thyroid normal.  Lungs:  Clear without wheezing, rales or rhonchi Cardiac: RR, without RMG Abdominal:  Soft, nontender, without masses, guarding, rebound, organomegaly or hernia Breasts:  Examined lying and sitting without masses, retractions, discharge or axillary adenopathy. Pelvic:  Ext, BUS, Vagina: Normal  Cervix: Normal  Uterus: Anteverted, normal size, shape and contour, midline and mobile nontender   Adnexa: Without masses or tenderness    Anus and perineum: Normal   Rectovaginal: Normal sphincter tone without palpated masses or tenderness.    Assessment/Plan:  51 y.o. G66P0010 female for annual exam with regular menses, vasectomy birth control.   1. Regular menses. Continues with regular menses. Does have occasional skips once or twice yearly but no prolonged or atypical bleeding. No significant hot flushes or sweats. I discussed the perimenopausal period with her and what to expect. Will monitor symptoms and if she continues with regular menses or as skips but regular when they occur then will monitor. If prolonged or atypical bleeding or develops significant symptoms such as hot flushes and sweats then she will follow up for further evaluation and discussion of  treatment options. 2. Patient notes after urinating when she goes to stand up she will lose a few more drops of urine. This is consistent with each void. No other symptoms such as pressure, discomfort, frequency, urgency. Will check baseline urine analysis. Exam without evidence of cystocele or urethral pathology. We discussed that this probably is due to aging and subtle changes in bladder support which may trap a little bit of urine while seated and then when she stands and changes the angle she loses the residual. 3. Mammography 07/2016. Continue with annual mammography when due. SBE monthly reviewed. 4. Pap smear/HPV 09/2013 negative. No Pap smear done today. No history of significant abnormal Pap smears. Plan repeat Pap smear approaching 5 year interval per current screening guidelines. 5. Colonoscopy 2016. Repeat at their recommended interval. 6. Health maintenance. No routine lab work done as patient does this elsewhere. Follow up in one year, sooner as needed.    Dara Lords MD, 9:14 AM 12/24/2016

## 2017-07-15 ENCOUNTER — Other Ambulatory Visit: Payer: Self-pay | Admitting: Gynecology

## 2017-07-15 DIAGNOSIS — Z1231 Encounter for screening mammogram for malignant neoplasm of breast: Secondary | ICD-10-CM

## 2017-08-09 ENCOUNTER — Ambulatory Visit
Admission: RE | Admit: 2017-08-09 | Discharge: 2017-08-09 | Disposition: A | Discharge status: Home / Self Care | Payer: BC Managed Care – PPO | Source: Ambulatory Visit | Attending: Gynecology | Admitting: Gynecology

## 2017-08-09 DIAGNOSIS — Z1231 Encounter for screening mammogram for malignant neoplasm of breast: Secondary | ICD-10-CM

## 2018-01-03 ENCOUNTER — Encounter: Payer: Self-pay | Admitting: Gynecology

## 2018-01-03 ENCOUNTER — Ambulatory Visit: Payer: BC Managed Care – PPO | Admitting: Gynecology

## 2018-01-03 VITALS — BP 126/82

## 2018-01-03 DIAGNOSIS — N760 Acute vaginitis: Secondary | ICD-10-CM | POA: Diagnosis not present

## 2018-01-03 DIAGNOSIS — R350 Frequency of micturition: Secondary | ICD-10-CM

## 2018-01-03 DIAGNOSIS — B9689 Other specified bacterial agents as the cause of diseases classified elsewhere: Secondary | ICD-10-CM

## 2018-01-03 LAB — WET PREP FOR TRICH, YEAST, CLUE

## 2018-01-03 MED ORDER — METRONIDAZOLE 500 MG PO TABS: 500.0000 mg | ORAL_TABLET | Freq: Two times a day (BID) | ORAL | 0 refills | Status: DC

## 2018-01-03 NOTE — Addendum Note (Signed)
Addended by: Dayna Barker on: 01/03/2018 03:30 PM   Modules accepted: Orders

## 2018-01-03 NOTE — Patient Instructions (Signed)
Take the antibiotic twice daily for 7 days.  Avoid alcohol while taking.  We may call you if the urine culture grows out bacteria but otherwise follow-up if your symptoms persist, worsen or recur.

## 2018-01-03 NOTE — Progress Notes (Signed)
    Mercedes Morton Oct 19, 1965 [id number]        52 y.o.  G1P0010 presents with a 2-day history 2-week history of vaginal irritation particularly in the periclitoral region.  Also some urinary frequency.  No significant discharge, itching or odor.  No urgency low back pain fever or chills.  No nausea vomiting diarrhea or significant constipation.  Past medical history,surgical history, problem list, medications, allergies, family history and social history were all reviewed and documented in the EPIC chart.  Directed ROS with pertinent positives and negatives documented in the history of present illness/assessment and plan.  Exam: [name] assistant Vitals:   01/03/18 1402  BP: 126/82   General appearance:  Normal Spine straight without CVA tenderness Abdomen soft nontender without masses guarding rebound Pelvic external BUS vagina somewhat erythematous with white frothy discharge.  Bimanual without masses or tenderness.  Assessment/Plan:  52 y.o. G1P0010 with exam and wet prep consistent with bacterial vaginosis.  I think this is accounting for her vaginal/periclitoral irritation.  Her urine analysis does show many bacteria but appears contaminated with 10-20 squamous cells greater than 60 WBCs.  Discussed treatment options for the bacterial vaginosis and she elects for Flagyl 500 mg twice daily x7 days.  Alcohol avoidance reviewed.  Will hold on treating for UTI and culture given possible contamination and will wait culture results and treat if positive.  Patient and I discussed this so she knows we may be calling her to treat the urine if indeed it grows out bacteria.  Otherwise she will take the Flagyl and she will follow-up if her symptoms persist, worsen or recur.    Dara Lords MD, 2:21 PM 01/03/2018

## 2018-01-06 LAB — URINALYSIS, COMPLETE W/RFL CULTURE
BILIRUBIN URINE: NEGATIVE
HYALINE CAST: NONE SEEN /LPF
Ketones, ur: NEGATIVE
Nitrites, Initial: NEGATIVE
PROTEIN: NEGATIVE
SPECIFIC GRAVITY, URINE: 1.007 (ref 1.001–1.03)
pH: 5.5 (ref 5.0–8.0)

## 2018-01-06 LAB — URINE CULTURE
MICRO NUMBER: 90577745
SPECIMEN QUALITY: ADEQUATE

## 2018-01-06 LAB — CULTURE INDICATED

## 2018-01-17 ENCOUNTER — Other Ambulatory Visit: Payer: Self-pay | Admitting: *Deleted

## 2018-01-17 MED ORDER — SULFAMETHOXAZOLE-TRIMETHOPRIM 800-160 MG PO TABS: 1.0000 | ORAL_TABLET | Freq: Two times a day (BID) | ORAL | 0 refills | Status: DC

## 2018-01-21 ENCOUNTER — Telehealth: Payer: Self-pay | Admitting: *Deleted

## 2018-01-21 NOTE — Telephone Encounter (Signed)
Patient informed. 

## 2018-01-21 NOTE — Telephone Encounter (Signed)
Patient was prescribed Flagyl 500 mg tablet on OV 01/03/18, states she took pills for 3 days and began to breakout with hives and itching, stopped pills and started on benadryl. No longer having vaginal irritation, asked if she should be prescribed another Rx since she didn't finish dose. Please advise

## 2018-01-21 NOTE — Telephone Encounter (Signed)
As long as symptoms are gone then I would treat based on that and no further medication at this time.

## 2018-03-11 ENCOUNTER — Encounter: Payer: Self-pay | Admitting: Gynecology

## 2018-03-11 ENCOUNTER — Ambulatory Visit: Payer: BC Managed Care – PPO | Admitting: Gynecology

## 2018-03-11 VITALS — BP 126/80 | Ht 70.0 in | Wt 282.0 lb

## 2018-03-11 DIAGNOSIS — Z1151 Encounter for screening for human papillomavirus (HPV): Secondary | ICD-10-CM | POA: Diagnosis not present

## 2018-03-11 DIAGNOSIS — N926 Irregular menstruation, unspecified: Secondary | ICD-10-CM | POA: Diagnosis not present

## 2018-03-11 DIAGNOSIS — Z01419 Encounter for gynecological examination (general) (routine) without abnormal findings: Secondary | ICD-10-CM

## 2018-03-11 NOTE — Progress Notes (Signed)
    Mercedes Morton 29-May-1966 [id number]        52 y.o.  G1P0010 for annual gynecologic exam.  Notes over the past year her menses have become more irregular with LMP in February.  No prolonged or atypical bleeding.  No significant hot flushes or sweats.  Past medical history,surgical history, problem list, medications, allergies, family history and social history were all reviewed and documented as reviewed in the EPIC chart.  ROS:  Performed with pertinent positives and negatives included in the history, assessment and plan.   Additional significant findings : None   Exam: Mercedes PortelaKim Morton assistant Vitals:   03/11/18 0859  BP: 126/80  Weight: 282 lb (127.9 kg)  Height: 5\' 10"  (1.778 m)   Body mass index is 40.46 kg/m.  General appearance:  Normal affect, orientation and appearance. Skin: Grossly normal HEENT: Without gross lesions.  No cervical or supraclavicular adenopathy. Thyroid normal.  Lungs:  Clear without wheezing, rales or rhonchi Cardiac: RR, without RMG Abdominal:  Soft, nontender, without masses, guarding, rebound, organomegaly or hernia Breasts:  Examined lying and sitting without masses, retractions, discharge or axillary adenopathy. Pelvic:  Ext, BUS, Vagina: Normal  Cervix: Normal  Uterus: Anteverted, grossly normal size, midline and mobile nontender   Adnexa: Without gross masses or tenderness    Anus and perineum: Normal   Rectovaginal: Normal sphincter tone without palpated masses or tenderness.    Assessment/Plan:  52 y.o. 21P0010 female for annual gynecologic exam with irregular menses, vasectomy birth control.   1. Perimenopausal.  We discussed what to expect in the perimenopause.  She will keep a menstrual calendar as long as less frequent but regular menses then will follow.  If prolonged or atypical bleeding where she goes more than 1 year without bleeding and then has a bleeding episode she knows to call.  Not having significant hot flushes or sweats.  If  she develops significant symptoms she will follow-up to discuss her options. 2. Pap smear/HPV 09/2013 area Pap smear/HPV today.  No history of significant abnormal Pap smears. 3. Mammography coming due in December and I reminded her to schedule this.  Breast exam normal today. 4. Colonoscopy 2016.  Repeat at their recommended interval. 5. Health maintenance.  No routine lab work done as patient does this elsewhere.  Follow-up 1 year, sooner as needed.   Mercedes Lordsimothy P Emelyn Roen MD, 9:15 AM 03/11/2018

## 2018-03-11 NOTE — Patient Instructions (Signed)
Follow-up in 1 year for annual exam.  Follow-up sooner if significant irregular bleeding or significant menopausal symptoms.

## 2018-03-11 NOTE — Addendum Note (Signed)
Addended by: Dayna BarkerGARDNER, KIMBERLY K on: 03/11/2018 10:43 AM   Modules accepted: Orders

## 2018-03-12 LAB — PAP IG AND HPV HIGH-RISK: HPV DNA HIGH RISK: NOT DETECTED

## 2018-03-13 ENCOUNTER — Other Ambulatory Visit: Payer: Self-pay | Admitting: Gynecology

## 2018-03-13 MED ORDER — FLUCONAZOLE 150 MG PO TABS: 150.0000 mg | ORAL_TABLET | Freq: Once | ORAL | 0 refills | Status: AC

## 2018-07-03 ENCOUNTER — Other Ambulatory Visit: Payer: Self-pay | Admitting: Gynecology

## 2018-07-03 DIAGNOSIS — Z1231 Encounter for screening mammogram for malignant neoplasm of breast: Secondary | ICD-10-CM

## 2018-08-14 ENCOUNTER — Ambulatory Visit
Admission: RE | Admit: 2018-08-14 | Discharge: 2018-08-14 | Disposition: A | Discharge status: Home / Self Care | Payer: BC Managed Care – PPO | Source: Ambulatory Visit | Attending: Gynecology | Admitting: Gynecology

## 2018-08-14 DIAGNOSIS — Z1231 Encounter for screening mammogram for malignant neoplasm of breast: Secondary | ICD-10-CM

## 2019-03-16 ENCOUNTER — Other Ambulatory Visit: Payer: Self-pay

## 2019-03-17 ENCOUNTER — Encounter: Payer: Self-pay | Admitting: Gynecology

## 2019-03-17 ENCOUNTER — Ambulatory Visit: Payer: BC Managed Care – PPO | Admitting: Gynecology

## 2019-03-17 VITALS — BP 134/84 | Ht 70.0 in | Wt 289.0 lb

## 2019-03-17 DIAGNOSIS — N951 Menopausal and female climacteric states: Secondary | ICD-10-CM

## 2019-03-17 DIAGNOSIS — N898 Other specified noninflammatory disorders of vagina: Secondary | ICD-10-CM

## 2019-03-17 DIAGNOSIS — Z01419 Encounter for gynecological examination (general) (routine) without abnormal findings: Secondary | ICD-10-CM

## 2019-03-17 LAB — WET PREP FOR TRICH, YEAST, CLUE

## 2019-03-17 MED ORDER — FLUCONAZOLE 150 MG PO TABS: 150.0000 mg | ORAL_TABLET | Freq: Once | ORAL | 0 refills | Status: AC

## 2019-03-17 NOTE — Progress Notes (Addendum)
    Mercedes Morton 21-Jun-1966 [id number]        53 y.o.  G1P0010 for annual gynecologic exam.  Continues to have menses every 3 months.  No prolonged bleeding.  No significant hot flashes or sweats.  Does note vaginal irritation and itching over the last several weeks.  No discharge or odor.  No urinary symptoms such as frequency dysuria urgency.  Past medical history,surgical history, problem list, medications, allergies, family history and social history were all reviewed and documented as reviewed in the EPIC chart.  ROS:  Performed with pertinent positives and negatives included in the history, assessment and plan.   Additional significant findings : None   Exam: Caryn Bee assistant Vitals:   03/17/19 0835  BP: 134/84  Weight: 289 lb (131.1 kg)  Height: 5\' 10"  (1.778 m)   Body mass index is 41.47 kg/m.  General appearance:  Normal affect, orientation and appearance. Skin: Grossly normal HEENT: Without gross lesions.  No cervical or supraclavicular adenopathy. Thyroid normal.  Lungs:  Clear without wheezing, rales or rhonchi Cardiac: RR, without RMG Abdominal:  Soft, nontender, without masses, guarding, rebound, organomegaly or hernia Breasts:  Examined lying and sitting without masses, retractions, discharge or axillary adenopathy. Pelvic:  Ext, BUS, Vagina: With slight white discharge  Cervix: Normal  Uterus: Anteverted, normal size, shape and contour, midline and mobile nontender   Adnexa: Without masses or tenderness    Anus and perineum: Normal   Rectovaginal: Normal sphincter tone without palpated masses or tenderness.    Assessment/Plan:  53 y.o. G28P0010 female for annual gynecologic exam.  Vasectomy birth control  1. Perimenopause.  Having menses every 3 months or so.  Having some hot flushes and sweats but not significant.  Will keep menstrual calendar and as long as less frequent but regular when occur then will monitor.  If prolonged or atypical bleeding or  significant menopausal symptoms she will represent for evaluation. 2. Vaginal irritation.  Wet prep is positive for yeast.  Will treat with Diflucan 150 mg x 1 dose.  Additional pill given in the event her symptoms continue despite the 1 dose or she has recurrence of symptoms over the summer. 3. Pap smear/HPV 2019.  No Pap smear done today.  No history of abnormal Pap smears.  Plan repeat Pap smear/HPV at 5-year interval per current screening guidelines. 4. Mammography 07/2018.  Continue with annual mammography end of this year.  Breast exam normal today. 5. Colonoscopy 2016.  Repeat at their recommended interval. 6. Health maintenance.  No routine lab work done as patient does this elsewhere.  Follow-up 1 year, sooner as needed.   Anastasio Auerbach MD, 8:54 AM 03/17/2019

## 2019-03-17 NOTE — Patient Instructions (Signed)
Take the one Diflucan pill to treat the yeast infection.  Save the second pill in case you have a recurrence.  Follow-up in 1 year for annual exam

## 2019-05-20 ENCOUNTER — Encounter: Payer: Self-pay | Admitting: Gynecology

## 2019-07-28 ENCOUNTER — Other Ambulatory Visit: Payer: Self-pay | Admitting: Internal Medicine

## 2019-07-28 DIAGNOSIS — Z1231 Encounter for screening mammogram for malignant neoplasm of breast: Secondary | ICD-10-CM

## 2019-08-19 ENCOUNTER — Ambulatory Visit
Admission: RE | Admit: 2019-08-19 | Discharge: 2019-08-19 | Disposition: A | Discharge status: Home / Self Care | Payer: BC Managed Care – PPO | Source: Ambulatory Visit | Attending: Internal Medicine | Admitting: Internal Medicine

## 2019-08-19 ENCOUNTER — Other Ambulatory Visit: Payer: Self-pay

## 2019-08-19 DIAGNOSIS — Z1231 Encounter for screening mammogram for malignant neoplasm of breast: Secondary | ICD-10-CM

## 2020-04-20 ENCOUNTER — Ambulatory Visit (INDEPENDENT_AMBULATORY_CARE_PROVIDER_SITE_OTHER): Payer: BC Managed Care – PPO | Admitting: Obstetrics and Gynecology

## 2020-04-20 ENCOUNTER — Other Ambulatory Visit: Payer: Self-pay

## 2020-04-20 ENCOUNTER — Encounter: Payer: Self-pay | Admitting: Obstetrics and Gynecology

## 2020-04-20 VITALS — BP 130/82 | Ht 70.0 in | Wt 293.0 lb

## 2020-04-20 DIAGNOSIS — B373 Candidiasis of vulva and vagina: Secondary | ICD-10-CM | POA: Diagnosis not present

## 2020-04-20 DIAGNOSIS — B3731 Acute candidiasis of vulva and vagina: Secondary | ICD-10-CM

## 2020-04-20 DIAGNOSIS — N898 Other specified noninflammatory disorders of vagina: Secondary | ICD-10-CM

## 2020-04-20 DIAGNOSIS — N760 Acute vaginitis: Secondary | ICD-10-CM

## 2020-04-20 DIAGNOSIS — Z01419 Encounter for gynecological examination (general) (routine) without abnormal findings: Secondary | ICD-10-CM | POA: Diagnosis not present

## 2020-04-20 DIAGNOSIS — B9689 Other specified bacterial agents as the cause of diseases classified elsewhere: Secondary | ICD-10-CM | POA: Diagnosis not present

## 2020-04-20 DIAGNOSIS — N951 Menopausal and female climacteric states: Secondary | ICD-10-CM

## 2020-04-20 LAB — WET PREP FOR TRICH, YEAST, CLUE

## 2020-04-20 MED ORDER — CLINDAMYCIN PHOSPHATE 2 % VA CREA: 1.0000 | TOPICAL_CREAM | Freq: Every day | VAGINAL | 0 refills | Status: AC

## 2020-04-20 MED ORDER — FLUCONAZOLE 150 MG PO TABS: 150.0000 mg | ORAL_TABLET | ORAL | 0 refills | Status: DC

## 2020-04-20 NOTE — Progress Notes (Addendum)
Mercedes Morton May 18, 1966 [id number]  SUBJECTIVE:  54 y.o. G1P0010 female for annual routine gynecologic exam. She has no gynecologic concerns.  Concerned about recurrent vaginal yeast infections since starting the Jardiance.  Has been self treating with vagisil prn.  LMP was November 2020, no vaginal bleeding since then.  Current Outpatient Medications  Medication Sig Dispense Refill  . aspirin 81 MG tablet Take 81 mg by mouth daily.    Marland Kitchen buPROPion (WELLBUTRIN XL) 150 MG 24 hr tablet Take 150 mg by mouth daily.    . Calcium Carbonate-Vitamin D (CALCIUM + D PO) Take by mouth.      . empagliflozin (JARDIANCE) 10 MG TABS tablet Take by mouth daily.    Marland Kitchen glimepiride (AMARYL) 4 MG tablet Take 4 mg by mouth daily with breakfast.    . LISINOPRIL PO Take by mouth.      . Multiple Vitamin (MULTIVITAMIN) tablet Take 1 tablet by mouth daily.      . Semaglutide,0.25 or 0.5MG /DOS, (OZEMPIC, 0.25 OR 0.5 MG/DOSE,) 2 MG/1.5ML SOPN Inject into the skin.    Marland Kitchen SIMVASTATIN PO Take by mouth.       No current facility-administered medications for this visit.   Allergies: Flagyl [metronidazole]  Patient's last menstrual period was 01/12/2019.  Past medical history,surgical history, problem list, medications, allergies, family history and social history were all reviewed and documented as reviewed in the EPIC chart.  ROS:  Feeling well. No dyspnea or chest pain on exertion.  No abdominal pain, change in bowel habits, black or bloody stools.  No urinary tract symptoms. GYN ROS: no abnormal bleeding, pelvic pain or discharge, no breast pain or new or enlarging lumps on self exam.No neurological complaints.   OBJECTIVE:  BP 130/82 (Cuff Size: Large)   Ht 5\' 10"  (1.778 m)   Wt 293 lb (132.9 kg)   LMP 01/12/2019   BMI 42.04 kg/m  The patient appears well, alert, oriented x 3, in no distress. ENT normal.  Neck supple. No cervical or supraclavicular adenopathy or thyromegaly.  Lungs are clear, good air entry,  no wheezes, rhonchi or rales. S1 and S2 normal, no murmurs, regular rate and rhythm.  Abdomen soft without tenderness, guarding, mass or organomegaly.  Neurological is normal, no focal findings.  BREAST EXAM: breasts appear normal, no suspicious masses, no skin or nipple changes or axillary nodes  PELVIC EXAM: VULVA: Although are diffusely blanched into the posterior fourchette, perineum, perianal areas probably more consistent with yeast irritation, otherwise normal appearing vulva with no masses, tenderness or lesions, VAGINA: normal appearing vagina with normal color and small amount of chunky white discharge, no lesions, CERVIX: normal appearing cervix without discharge or lesions, UTERUS: uterus is normal size, shape, consistency and nontender, ADNEXA: normal adnexa in size, nontender and no masses, WET MOUNT done - results: + Clue cells and hyphae, no trichomonas, many WBC, numerous bacteria, 7-12 epithelial cells/hpf  Chaperone: 01/14/2019 (DNP student) present during the examination and performed the pelvic exam with me in attendance to confirm the exam findings   ASSESSMENT:  54 y.o. G1P0010 here for annual gynecologic exam  PLAN:   1. Nearing menopause.  Discussed menopause is defined as 1 year of amenorrhea.  Minor hot flashes and night sweats but manageable symptoms.  No irregular bleeding at this point.  Continue to monitor. 2.  Vaginal discharge and irritation.  Likely recurrent vaginal candidiasis with appearance of discharge on exam today.  Vaginal wet mount shows hyphae and clue cells.  Jardiance  use is also a risk factor.  We discussed using Monistat cream 7 night course and applying to the external vulva as needed.  Also provided her with a prescription for Diflucan 150 mg tablets p.o. #6 to use weekly as needed.  Will also recommend prescription for vaginal Cleocin cream 2% nightly x1 week to address BV. 3. Pap smear/HPV 2019.  No significant history of abnormal Pap smears.   Next Pap smear due 2024 following the current guidelines recommending the 5 year interval. 4. Mammogram 07/2019.  Normal breast exam today.  She is reminded to schedule an annual mammogram this year when due. 5. Colonoscopy 2016.  Recommended that she follow up at the recommended interval.   6. Health maintenance.  No labs today as she normally has these completed elsewhere.  Return annually or sooner, prn.  Theresia Majors MD 04/20/20

## 2020-04-20 NOTE — Addendum Note (Signed)
Addended byPenni Bombard, Slaton Reaser D on: 04/20/2020 10:14 AM   Modules accepted: Orders

## 2020-07-05 ENCOUNTER — Other Ambulatory Visit: Payer: Self-pay | Admitting: Obstetrics and Gynecology

## 2020-07-05 DIAGNOSIS — Z1231 Encounter for screening mammogram for malignant neoplasm of breast: Secondary | ICD-10-CM

## 2020-07-13 ENCOUNTER — Other Ambulatory Visit: Payer: Self-pay | Admitting: Obstetrics and Gynecology

## 2020-08-22 ENCOUNTER — Other Ambulatory Visit: Payer: Self-pay

## 2020-08-22 ENCOUNTER — Ambulatory Visit
Admission: RE | Admit: 2020-08-22 | Discharge: 2020-08-22 | Disposition: A | Discharge status: Home / Self Care | Payer: BC Managed Care – PPO | Source: Ambulatory Visit | Attending: Obstetrics and Gynecology | Admitting: Obstetrics and Gynecology

## 2020-08-22 DIAGNOSIS — Z1231 Encounter for screening mammogram for malignant neoplasm of breast: Secondary | ICD-10-CM

## 2020-08-23 ENCOUNTER — Ambulatory Visit
Admission: RE | Admit: 2020-08-23 | Discharge: 2020-08-23 | Disposition: A | Discharge status: Home / Self Care | Payer: BC Managed Care – PPO | Source: Ambulatory Visit | Attending: Physician Assistant | Admitting: Physician Assistant

## 2020-08-23 ENCOUNTER — Other Ambulatory Visit: Payer: Self-pay | Admitting: Physician Assistant

## 2020-08-23 DIAGNOSIS — M25561 Pain in right knee: Secondary | ICD-10-CM

## 2021-02-02 ENCOUNTER — Encounter: Payer: Self-pay | Admitting: Registered"

## 2021-02-02 ENCOUNTER — Encounter: Payer: BC Managed Care – PPO | Attending: Internal Medicine | Admitting: Registered"

## 2021-02-02 DIAGNOSIS — E119 Type 2 diabetes mellitus without complications: Secondary | ICD-10-CM | POA: Insufficient documentation

## 2021-02-02 NOTE — Patient Instructions (Addendum)
Goals: Follow Diabetes Meal Plan as instructed Eat 3 meals and 2 snacks, every 3-5 hrs Aim to balance meals with 1/2 plate of non-starchy vegetables, 1/4 plate of lean protein, 1/4 plate of carbohydrates. See page 21 in book.  Balance snacks with carbohydrates + protein such as: Peanut butter + fruit Trail mix Peanut butter + crackers  Cheese + crackers Greek yogurt Add lean protein foods to meals/snacks Monitor glucose levels as instructed by your doctor

## 2021-02-02 NOTE — Progress Notes (Signed)
Diabetes Self-Management Education  Visit Type:  First/Initial  Appt. Start Time: 8:08 Appt. End Time: 9:35  02/02/2021  [name], identified by name and date of birth, is a 55 y.o. female with a diagnosis of Diabetes: Type 2.   ASSESSMENT  States she feels a little burnout with having diabetes and its been 20 years. States she has not made changes since being diagnosed. States she knows she needs to make changes but has not ben motivated to do so. States she is afraid of failure and feels like she does not know where to start.   States she was told to check BS at least 2x/week. States she checks mostly once a day: FBS (115-137).   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   Pt expectations: information on technology in the diabetes world, eating habits, foods and their role in diabetes  Last menstrual period 01/12/2019. There is no height or weight on file to calculate BMI.    Diabetes Self-Management Education - 02/02/21 0813       Health Coping   How would you rate your overall health? Fair      Psychosocial Assessment   Patient Belief/Attitude about Diabetes Defeat/Burnout    Self-care barriers None    Self-management support Friends;Family    Patient Concerns Nutrition/Meal planning;Healthy Lifestyle;Monitoring    Special Needs None    Preferred Learning Style No preference indicated    Learning Readiness Contemplating      Complications   Last HgB A1C per patient/outside source 7.3 %    How often do you check your blood sugar? 1-2 times/day    Fasting Blood glucose range (mg/dL) 89-381;017-510    Number of hypoglycemic episodes per month 0    Number of hyperglycemic episodes per week 0    Have you had a dilated eye exam in the past 12 months? Yes    Have you had a dental exam in the past 12 months? Yes    Are you checking your feet? Yes    How many  days per week are you checking your feet? 7      Dietary Intake   Breakfast skipped    Lunch skipped    Snack (afternoon) 3 pm - 2 nutty butter cookies + 2 chocolate chip cookies    Dinner 5 pm - pack of pistachios    Snack (evening) 7:30 pm - [employer] - 2 pc dinner (thigh and leg) + fries + 1/2 biscuit + sweet tea    Beverage(s) coke (12 oz), sweet tea (20 oz), water (32 oz); 64 oz      Exercise   Exercise Type ADL's    How many days per week to you exercise? 0    How many minutes per day do you exercise? 0    Total minutes per week of exercise 0      Patient Education   Previous Diabetes Education Yes (please comment)   when initially diagnosed 20 years ago   Disease state  Definition of diabetes, type 1 and 2, and the diagnosis of diabetes;Factors that contribute to the development of diabetes    Nutrition management  Role of diet in the treatment of diabetes and the relationship between the three main macronutrients and blood glucose level;Food label reading, portion sizes and measuring food.;Reviewed blood glucose goals for pre and post meals and how to evaluate  the patients' food intake on their blood glucose level.;Effects of alcohol on blood glucose and safety factors with consumption of alcohol.;Information on hints to eating out and maintain blood glucose control.    Physical activity and exercise  Role of exercise on diabetes management, blood pressure control and cardiac health.    Monitoring Purpose and frequency of SMBG.;Taught/discussed recording of test results and interpretation of SMBG.;Identified appropriate SMBG and/or A1C goals.    Acute complications Taught treatment of hypoglycemia - the 15 rule.;Discussed and identified patients' treatment of hyperglycemia.    Chronic complications Applicable immunizations;Lipid levels, blood glucose control and heart disease    Psychosocial adjustment Role of stress on diabetes;Worked with patient to identify barriers to care and  solutions;Helped patient identify a support system for diabetes management;Identified and addressed patients feelings and concerns about diabetes;Brainstormed with patient on coping mechanisms for social situations, getting support from significant others, dealing with feelings about diabetes      Individualized Goals (developed by patient)   Nutrition Follow meal plan discussed    Medications take my medication as prescribed    Monitoring  test my blood glucose as discussed    Reducing Risk examine blood glucose patterns;increase portions of nuts and seeds;treat hypoglycemia with 15 grams of carbs if blood glucose less than 70mg /dL      Post-Education Assessment   Patient understands the diabetes disease and treatment process. Demonstrates understanding / competency    Patient understands incorporating nutritional management into lifestyle. Demonstrates understanding / competency    Patient undertands incorporating physical activity into lifestyle. Demonstrates understanding / competency    Patient understands using medications safely. Demonstrates understanding / competency    Patient understands monitoring blood glucose, interpreting and using results Demonstrates understanding / competency    Patient understands prevention, detection, and treatment of acute complications. Demonstrates understanding / competency    Patient understands prevention, detection, and treatment of chronic complications. Needs Review    Patient understands how to develop strategies to address psychosocial issues. Demonstrates understanding / competency    Patient understands how to develop strategies to promote health/change behavior. Demonstrates understanding / competency      Outcomes   Program Status Not Completed             Learning Objective:  Patient will have a greater understanding of diabetes self-management. Patient education plan is to attend individual and/or group sessions per assessed needs  and concerns.   Plan:   Patient Instructions  Goals: Follow Diabetes Meal Plan as instructed Eat 3 meals and 2 snacks, every 3-5 hrs Aim to balance meals with 1/2 plate of non-starchy vegetables, 1/4 plate of lean protein, 1/4 plate of carbohydrates. See page 21 in book.  Balance snacks with carbohydrates + protein such as: Peanut butter + fruit Trail mix Peanut butter + crackers  Cheese + crackers Greek yogurt Add lean protein foods to meals/snacks Monitor glucose levels as instructed by your doctor     Expected Outcomes:  Demonstrated interest in learning. Expect positive outcomes  Education material provided: ADA - How to Thrive: A Guide for Your Journey with Diabetes  If problems or questions, patient to contact team via:  Phone and Email  Future DSME appointment: - 4-6 wks

## 2021-03-08 ENCOUNTER — Encounter: Payer: BC Managed Care – PPO | Attending: Internal Medicine | Admitting: Registered"

## 2021-03-08 ENCOUNTER — Encounter: Payer: Self-pay | Admitting: Registered"

## 2021-03-08 ENCOUNTER — Other Ambulatory Visit: Payer: Self-pay

## 2021-03-08 DIAGNOSIS — E119 Type 2 diabetes mellitus without complications: Secondary | ICD-10-CM | POA: Insufficient documentation

## 2021-03-08 NOTE — Patient Instructions (Signed)
-   Aim to increase physical activity to at least 15-20 min, 2 days/week.   - Continue having 3 meals a day.   - Aim to have 1/2 plate of vegetables with lunch and dinner.   - Try cooking 1 meal within the next week. Use [email or web address] as source.

## 2021-03-08 NOTE — Progress Notes (Signed)
Diabetes Self-Management Education  Visit Type:  Follow-up  Appt. Start Time: 8:06 Appt. End Time: 9:09  03/08/2021  Mercedes Morton, identified by name and date of birth, is a 55 y.o. female with a diagnosis of Diabetes: Type 2.   ASSESSMENT  States she had a cookout for the holiday. States she didn't cook anything; contributed paper products and sodas to event. States she doesn't like to cook and didn't really learn how to cook because of her mom passed away when she was 16. States she needs help learning how to season food.   Reports continuing to check BS 2 times a day: FBS (110, 113) and at night (135, 140).   States she did more walking at work for about 5 days after our previous appt and then stopped after that.   States she was feeling nauseous last night after 5 mini Hershey's before bed.   States she is using OTC vision drops.   Last menstrual period 01/12/2019. There is no height or weight on file to calculate BMI.    Diabetes Self-Management Education - 03/08/21 0818       Health Coping   How would you rate your overall health? Fair      Complications   How often do you check your blood sugar? 1-2 times/day    Fasting Blood glucose range (mg/dL) 67-893    Postprandial Blood glucose range (mg/dL) 810-175    Number of hypoglycemic episodes per month 0    Number of hyperglycemic episodes per week 0    Have you had a dental exam in the past 12 months? Yes    Are you checking your feet? Yes    How many days per week are you checking your feet? 7      Dietary Intake   Breakfast 7:30 am - multigrain cheerios    Lunch 11 am - [employer] - cheeseburger + fries + Coke    Snack (afternoon) gummy bears    Dinner 8 pm - Japanese - hibachi shrimp + fried rice + mixed vegetables + sweet tea    Snack (evening) 10:30 pm - 5 mini Hershey's    Beverage(s) sweet tea, Coke, water (2*16 oz; 32 oz), 2% milk; 64+ oz      Exercise   Exercise Type ADL's;Light (walking / raking leaves)       Patient Education   Physical activity and exercise  Role of exercise on diabetes management, blood pressure control and cardiac health.;Helped patient identify appropriate exercises in relation to his/her diabetes, diabetes complications and other health issue.    Medications Reviewed patients medication for diabetes, action, purpose, timing of dose and side effects.    Chronic complications Relationship between chronic complications and blood glucose control;Assessed and discussed foot care and prevention of foot problems;Identified and discussed with patient  current chronic complications;Retinopathy and reason for yearly dilated eye exams;Nephropathy, what it is, prevention of, the use of ACE, ARB's and early detection of through urine microalbumia.;Reviewed with patient heart disease, higher risk of, and prevention;Lipid levels, blood glucose control and heart disease    Personal strategies to promote health Lifestyle issues that need to be addressed for better diabetes care      Individualized Goals (developed by patient)   Physical Activity Exercise 1-2 times per week;15 minutes per day      Post-Education Assessment   Patient understands prevention, detection, and treatment of chronic complications. Demonstrates understanding / competency      Outcomes   Program  Status Completed      Subsequent Visit   Since your last visit have you continued or begun to take your medications as prescribed? Yes    Since your last visit have you had your blood pressure checked? No    Since your last visit have you experienced any weight changes? No change    Since your last visit, are you checking your blood glucose at least once a day? No             Learning Objective:  Patient will have a greater understanding of diabetes self-management. Patient education plan is to attend individual and/or group sessions per assessed needs and concerns.   Plan:   Patient Instructions  - Aim to  increase physical activity to at least 15-20 min, 2 days/week.   - Continue having 3 meals a day.   - Aim to have 1/2 plate of vegetables with lunch and dinner.   - Try cooking 1 meal within the next week. Use [email or web address] as source.    Expected Outcomes:  Demonstrated interest in learning. Expect positive outcomes  Education material provided: Diabetes Resources - [place], [place], [place] Handout  If problems or questions, patient to contact team via:  Phone and Email  Future DSME appointment: - PRN

## 2021-04-25 ENCOUNTER — Encounter: Payer: Self-pay | Admitting: Nurse Practitioner

## 2021-04-25 ENCOUNTER — Ambulatory Visit (INDEPENDENT_AMBULATORY_CARE_PROVIDER_SITE_OTHER): Payer: BC Managed Care – PPO | Admitting: Nurse Practitioner

## 2021-04-25 ENCOUNTER — Other Ambulatory Visit: Payer: Self-pay

## 2021-04-25 VITALS — BP 122/78 | Ht 69.0 in | Wt 291.0 lb

## 2021-04-25 DIAGNOSIS — N393 Stress incontinence (female) (male): Secondary | ICD-10-CM

## 2021-04-25 DIAGNOSIS — Z780 Asymptomatic menopausal state: Secondary | ICD-10-CM | POA: Diagnosis not present

## 2021-04-25 DIAGNOSIS — Z01419 Encounter for gynecological examination (general) (routine) without abnormal findings: Secondary | ICD-10-CM

## 2021-04-25 NOTE — Progress Notes (Signed)
Mercedes Morton 05-30-1966 [id number]   History:  55 y.o. G1P0010 presents for annual exam. She complains of some urinary incontinence. This has been ongoing for years and occurs with position changes such as standing, sitting, or bending over. It is a small amount of urine unless she is coughing/laughing then she experiences significant leakage. Postmenopausal - no HRT, no bleeding.  History of yeast infections that worsened when she started Jardiance. She feels these have improved and uses OTC monistat with good management. Normal pap and mammogram history. History of DM, HTN.  Gynecologic History Patient's last menstrual period was 01/12/2019.   Contraception/Family planning: post menopausal status Sexually active: yes  Health Maintenance Last Pap: 03/11/2018. Results were: Normal, 5-year repeat Last mammogram: 08/22/2020. Results were: Normal Last colonoscopy: 2016. Results were: Normal, 10-year recall Last Dexa: Not indicated  Past medical history, past surgical history, family history and social history were all reviewed and documented in the EPIC chart.  ROS:  A ROS was performed and pertinent positives and negatives are included.  Exam:  Vitals:   04/25/21 1329  BP: 122/78  Weight: 291 lb (132 kg)  Height: 5\' 9"  (1.753 m)   Body mass index is 42.97 kg/m.  General appearance:  Normal Thyroid:  Symmetrical, normal in size, without palpable masses or nodularity. Respiratory  Auscultation:  Clear without wheezing or rhonchi Cardiovascular  Auscultation:  Regular rate, without rubs, murmurs or gallops  Edema/varicosities:  Not grossly evident Abdominal  Soft,nontender, without masses, guarding or rebound.  Liver/spleen:  No organomegaly noted  Hernia:  None appreciated  Skin  Inspection:  Grossly normal Breasts: Examined lying and sitting.   Right: Without masses, retractions, nipple discharge or axillary adenopathy.   Left: Without masses, retractions, nipple  discharge or axillary adenopathy. Genitourinary   Inguinal/mons:  Normal without inguinal adenopathy  External genitalia:  Normal appearing vulva with no masses, tenderness, or lesions  BUS/Urethra/Skene's glands:  Normal  Vagina:  Normal appearing with normal color and discharge, no lesions  Cervix:  Normal appearing without discharge or lesions  Uterus:  Difficult to palpate due to body habitus but no gross masses or tenderness  Adnexa/parametria:     Rt: Normal in size, without masses or tenderness.   Lt: Normal in size, without masses or tenderness.  Anus and perineum: Normal  Digital rectal exam: Normal sphincter tone without palpated masses or tenderness  Patient informed chaperone available to be present for breast and pelvic exam. Patient has requested no chaperone to be present. Patient has been advised what will be completed during breast and pelvic exam.   Assessment/Plan:  55 y.o. G1P0010 for annual exam.   Well female exam with routine gynecological exam - Education provided on SBEs, importance of preventative screenings, current guidelines, high calcium diet, regular exercise, and multivitamin daily. Labs with PCP.   Postmenopausal - no HRT, no bleeding.   Stress incontinence - She complains of some urinary incontinence. This has been ongoing for years and occurs with position changes such as standing, sitting, or bending over. It is a small amount of  unless she is coughing/laughing then she experiences significant leakage. We discussed management with pelvic floor strengthening exercises, voiding more often, avoiding trigger foods/beverages, weight loss management. If she does not have improvement we discussed a urology/urogyn referral. She is agreeable.   Screening for cervical cancer - Normal Pap history.  Will repeat at 5-year interval per guidelines.  Screening for breast cancer - Normal mammogram history.  Continue annual screenings.  Normal breast exam  today.  Screening for colon cancer - 2016 colonoscopy. Will repeat at GI's recommended interval.   Return in 1 year for annual.   Olivia Mackie DNP, 1:54 PM 04/25/2021

## 2021-12-06 ENCOUNTER — Other Ambulatory Visit: Payer: Self-pay | Admitting: Internal Medicine

## 2021-12-06 DIAGNOSIS — Z1231 Encounter for screening mammogram for malignant neoplasm of breast: Secondary | ICD-10-CM

## 2021-12-07 ENCOUNTER — Ambulatory Visit
Admission: RE | Admit: 2021-12-07 | Discharge: 2021-12-07 | Disposition: A | Discharge status: Home / Self Care | Payer: BC Managed Care – PPO | Source: Ambulatory Visit | Attending: Internal Medicine | Admitting: Internal Medicine

## 2021-12-07 DIAGNOSIS — Z1231 Encounter for screening mammogram for malignant neoplasm of breast: Secondary | ICD-10-CM

## 2022-03-12 DIAGNOSIS — N1831 Chronic kidney disease, stage 3a: Secondary | ICD-10-CM | POA: Diagnosis not present

## 2022-03-12 DIAGNOSIS — L739 Follicular disorder, unspecified: Secondary | ICD-10-CM | POA: Diagnosis not present

## 2022-03-12 DIAGNOSIS — E1121 Type 2 diabetes mellitus with diabetic nephropathy: Secondary | ICD-10-CM | POA: Diagnosis not present

## 2022-07-03 DIAGNOSIS — N1832 Chronic kidney disease, stage 3b: Secondary | ICD-10-CM | POA: Diagnosis not present

## 2022-07-03 DIAGNOSIS — Z13220 Encounter for screening for lipoid disorders: Secondary | ICD-10-CM | POA: Diagnosis not present

## 2022-07-03 DIAGNOSIS — E1121 Type 2 diabetes mellitus with diabetic nephropathy: Secondary | ICD-10-CM | POA: Diagnosis not present

## 2022-07-03 DIAGNOSIS — F3341 Major depressive disorder, recurrent, in partial remission: Secondary | ICD-10-CM | POA: Diagnosis not present

## 2022-07-03 DIAGNOSIS — Z0000 Encounter for general adult medical examination without abnormal findings: Secondary | ICD-10-CM | POA: Diagnosis not present

## 2022-07-03 DIAGNOSIS — Z6841 Body Mass Index (BMI) 40.0 and over, adult: Secondary | ICD-10-CM | POA: Diagnosis not present

## 2022-07-03 DIAGNOSIS — E11319 Type 2 diabetes mellitus with unspecified diabetic retinopathy without macular edema: Secondary | ICD-10-CM | POA: Diagnosis not present

## 2022-07-03 DIAGNOSIS — Z23 Encounter for immunization: Secondary | ICD-10-CM | POA: Diagnosis not present

## 2022-07-03 DIAGNOSIS — I10 Essential (primary) hypertension: Secondary | ICD-10-CM | POA: Diagnosis not present

## 2022-08-30 ENCOUNTER — Encounter: Payer: Self-pay | Admitting: Nurse Practitioner

## 2022-09-13 ENCOUNTER — Ambulatory Visit (INDEPENDENT_AMBULATORY_CARE_PROVIDER_SITE_OTHER): Payer: BC Managed Care – PPO | Admitting: Nurse Practitioner

## 2022-09-13 ENCOUNTER — Encounter: Payer: Self-pay | Admitting: Nurse Practitioner

## 2022-09-13 ENCOUNTER — Other Ambulatory Visit (HOSPITAL_COMMUNITY)
Admission: RE | Admit: 2022-09-13 | Discharge: 2022-09-13 | Disposition: A | Discharge status: Home / Self Care | Payer: BC Managed Care – PPO | Source: Ambulatory Visit | Attending: Nurse Practitioner | Admitting: Nurse Practitioner

## 2022-09-13 VITALS — BP 108/78 | HR 83 | Ht 69.5 in | Wt 285.0 lb

## 2022-09-13 DIAGNOSIS — Z780 Asymptomatic menopausal state: Secondary | ICD-10-CM | POA: Diagnosis not present

## 2022-09-13 DIAGNOSIS — Z01419 Encounter for gynecological examination (general) (routine) without abnormal findings: Secondary | ICD-10-CM

## 2022-09-13 DIAGNOSIS — N760 Acute vaginitis: Secondary | ICD-10-CM

## 2022-09-13 DIAGNOSIS — B3731 Acute candidiasis of vulva and vagina: Secondary | ICD-10-CM

## 2022-09-13 DIAGNOSIS — N898 Other specified noninflammatory disorders of vagina: Secondary | ICD-10-CM

## 2022-09-13 DIAGNOSIS — B9689 Other specified bacterial agents as the cause of diseases classified elsewhere: Secondary | ICD-10-CM

## 2022-09-13 LAB — WET PREP FOR TRICH, YEAST, CLUE

## 2022-09-13 MED ORDER — FLUCONAZOLE 150 MG PO TABS: 150.0000 mg | ORAL_TABLET | ORAL | 0 refills | Status: AC | PRN

## 2022-09-13 MED ORDER — FLUCONAZOLE 150 MG PO TABS: 150.0000 mg | ORAL_TABLET | ORAL | 0 refills | Status: AC

## 2022-09-13 MED ORDER — CLINDAMYCIN PHOSPHATE 2 % VA CREA: 1.0000 | TOPICAL_CREAM | Freq: Every day | VAGINAL | 0 refills | Status: AC

## 2022-09-13 NOTE — Progress Notes (Signed)
Mercedes Morton August 11, 1966 [id number]   History:  57 y.o. G1P0010 presents for annual exam. Postmenopausal - no HRT, no bleeding.  History of yeast infections that worsened when she started Jardiance. Having frequent symptoms. None today. Has been using OTC monistat. Normal pap and mammogram history. History of DM, HTN.  Gynecologic History Patient's last menstrual period was 01/12/2019.   Contraception/Family planning: post menopausal status Sexually active: yes  Health Maintenance Last Pap: 03/11/2018. Results were: Normal neg HPV, 5-year repeat Last mammogram: 12/07/2021. Results were: Normal Last colonoscopy: 2016. Results were: Normal, 10-year recall Last Dexa: Not indicated  Past medical history, past surgical history, family history and social history were all reviewed and documented in the EPIC chart. 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 Sister diagnosed with breast cancer at age 65.   ROS:  A ROS was performed and pertinent positives and negatives are included.  Exam:  Vitals:   09/13/22 0900  BP: 108/78  Pulse: 83  SpO2: 97%  Weight: 285 lb (129.3 kg)  Height: 5' 9.5" (1.765 m)    Body mass index is 41.48 kg/m.  General appearance:  Normal Thyroid:  Symmetrical, normal in size, without palpable masses or nodularity. Respiratory  Auscultation:  Clear without wheezing or rhonchi Cardiovascular  Auscultation:  Regular rate, without rubs, murmurs or gallops  Edema/varicosities:  Not grossly evident Abdominal  Soft,nontender, without masses, guarding or rebound.  Liver/spleen:  No organomegaly noted  Hernia:  None appreciated  Skin  Inspection:  Grossly normal Breasts: Examined lying and sitting.   Right: Without masses, retractions, nipple discharge or axillary adenopathy.  Left: Without masses, retractions, nipple discharge or axillary adenopathy. Genitourinary   Inguinal/mons:  Normal without inguinal adenopathy  External genitalia:  Normal appearing vulva with no  masses, tenderness, or lesions  BUS/Urethra/Skene's glands:  Normal  Vagina:  Chunky, white discharge and erythema  Cervix:  Normal appearing without discharge or lesions  Uterus:  Difficult to palpate due to body habitus but no gross masses or tenderness  Adnexa/parametria:     Rt: Normal in size, without masses or tenderness.   Lt: Normal in size, without masses or tenderness.  Anus and perineum: Normal  Digital rectal exam: Deferred  Patient informed chaperone available to be present for breast and pelvic exam. Patient has requested no chaperone to be present. Patient has been advised what will be completed during breast and pelvic exam.   Wet prep + yeast + clue cells (+ odor)  Assessment/Plan:  57 y.o. G1P0010 for annual exam.   Well female exam with routine gynecological exam - Plan: Cytology - PAP( [place]). Education provided on SBEs, importance of preventative screenings, current guidelines, high calcium diet, regular exercise, and multivitamin daily. Labs with PCP.   Postmenopausal - no HRT, no bleeding.   Recurrent vaginitis - Plan: fluconazole (DIFLUCAN) 150 MG tablet as needed. Having recurrent yeast infections on Jardiance. Using OTC monistat which is expensive.   Vaginal discharge - Plan: [place], CLUE. Positive for clue cells and yeast.   Vaginal candidiasis - Plan: fluconazole (DIFLUCAN) 150 MG tablet today and repeat in 3 days x 3 doses.   Bacterial vaginosis - Plan: clindamycin (CLEOCIN) 2 % vaginal cream nightly x 7 nights. Allergy to Metronidazole.   Screening for cervical cancer - Normal Pap history. Pap today.   Screening for breast cancer - Normal mammogram history.  Continue annual screenings.  Normal breast exam today.  Screening for colon cancer - 2016 colonoscopy. Will repeat at  GI's recommended interval.   Screening for osteoporosis - Average risk. Will plan DXA at age 69.   Return in 1 year for annual.     Tamela Gammon  DNP, 9:33 AM 09/13/2022

## 2022-09-14 LAB — CYTOLOGY - PAP
Comment: NEGATIVE
Diagnosis: NEGATIVE
High risk HPV: NEGATIVE

## 2023-01-01 DIAGNOSIS — N1832 Chronic kidney disease, stage 3b: Secondary | ICD-10-CM | POA: Diagnosis not present

## 2023-01-01 DIAGNOSIS — E11319 Type 2 diabetes mellitus with unspecified diabetic retinopathy without macular edema: Secondary | ICD-10-CM | POA: Diagnosis not present

## 2023-01-01 DIAGNOSIS — F3341 Major depressive disorder, recurrent, in partial remission: Secondary | ICD-10-CM | POA: Diagnosis not present

## 2023-03-01 ENCOUNTER — Other Ambulatory Visit: Payer: Self-pay | Admitting: Internal Medicine

## 2023-03-01 DIAGNOSIS — Z1231 Encounter for screening mammogram for malignant neoplasm of breast: Secondary | ICD-10-CM

## 2023-03-06 ENCOUNTER — Ambulatory Visit: Payer: BC Managed Care – PPO

## 2023-03-06 DIAGNOSIS — Z1231 Encounter for screening mammogram for malignant neoplasm of breast: Secondary | ICD-10-CM

## 2023-05-23 DIAGNOSIS — E113291 Type 2 diabetes mellitus with mild nonproliferative diabetic retinopathy without macular edema, right eye: Secondary | ICD-10-CM | POA: Diagnosis not present

## 2023-07-19 DIAGNOSIS — Z0000 Encounter for general adult medical examination without abnormal findings: Secondary | ICD-10-CM | POA: Diagnosis not present

## 2023-07-19 DIAGNOSIS — I10 Essential (primary) hypertension: Secondary | ICD-10-CM | POA: Diagnosis not present

## 2023-07-19 DIAGNOSIS — E1121 Type 2 diabetes mellitus with diabetic nephropathy: Secondary | ICD-10-CM | POA: Diagnosis not present

## 2023-07-19 DIAGNOSIS — Z23 Encounter for immunization: Secondary | ICD-10-CM | POA: Diagnosis not present

## 2023-07-19 DIAGNOSIS — F3341 Major depressive disorder, recurrent, in partial remission: Secondary | ICD-10-CM | POA: Diagnosis not present

## 2023-07-19 DIAGNOSIS — E11319 Type 2 diabetes mellitus with unspecified diabetic retinopathy without macular edema: Secondary | ICD-10-CM | POA: Diagnosis not present

## 2023-11-21 DIAGNOSIS — H353112 Nonexudative age-related macular degeneration, right eye, intermediate dry stage: Secondary | ICD-10-CM | POA: Diagnosis not present

## 2024-01-17 DIAGNOSIS — Z6841 Body Mass Index (BMI) 40.0 and over, adult: Secondary | ICD-10-CM | POA: Diagnosis not present

## 2024-01-17 DIAGNOSIS — N1831 Chronic kidney disease, stage 3a: Secondary | ICD-10-CM | POA: Diagnosis not present

## 2024-01-17 DIAGNOSIS — I10 Essential (primary) hypertension: Secondary | ICD-10-CM | POA: Diagnosis not present

## 2024-01-17 DIAGNOSIS — E785 Hyperlipidemia, unspecified: Secondary | ICD-10-CM | POA: Diagnosis not present

## 2024-01-17 DIAGNOSIS — E11319 Type 2 diabetes mellitus with unspecified diabetic retinopathy without macular edema: Secondary | ICD-10-CM | POA: Diagnosis not present

## 2024-03-21 IMAGING — MG MM DIGITAL SCREENING BILAT W/ TOMO AND CAD
6 of 10 series · 6 of 30 positions shown · non-contrast
Comparison: Previous exam(s).

ACR Breast Density Category a: The breast tissue is almost entirely
fatty.

CLINICAL DATA: Screening.

EXAM:
DIGITAL SCREENING BILATERAL MAMMOGRAM WITH TOMOSYNTHESIS AND CAD
TECHNIQUE: Bilateral screening digital craniocaudal and mediolateral oblique
mammograms were obtained. Bilateral screening digital breast
tomosynthesis was performed. The images were evaluated with
computer-aided detection.

[L MLO synth-2D]
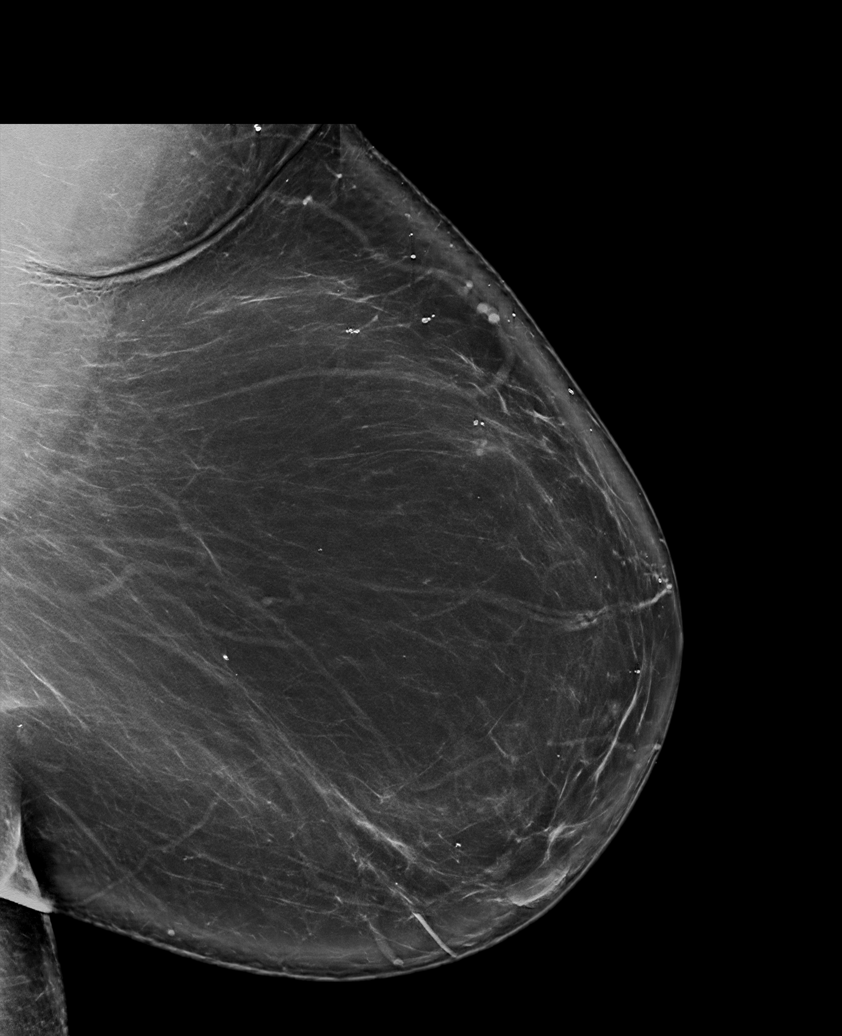

[R CC synth-2D]
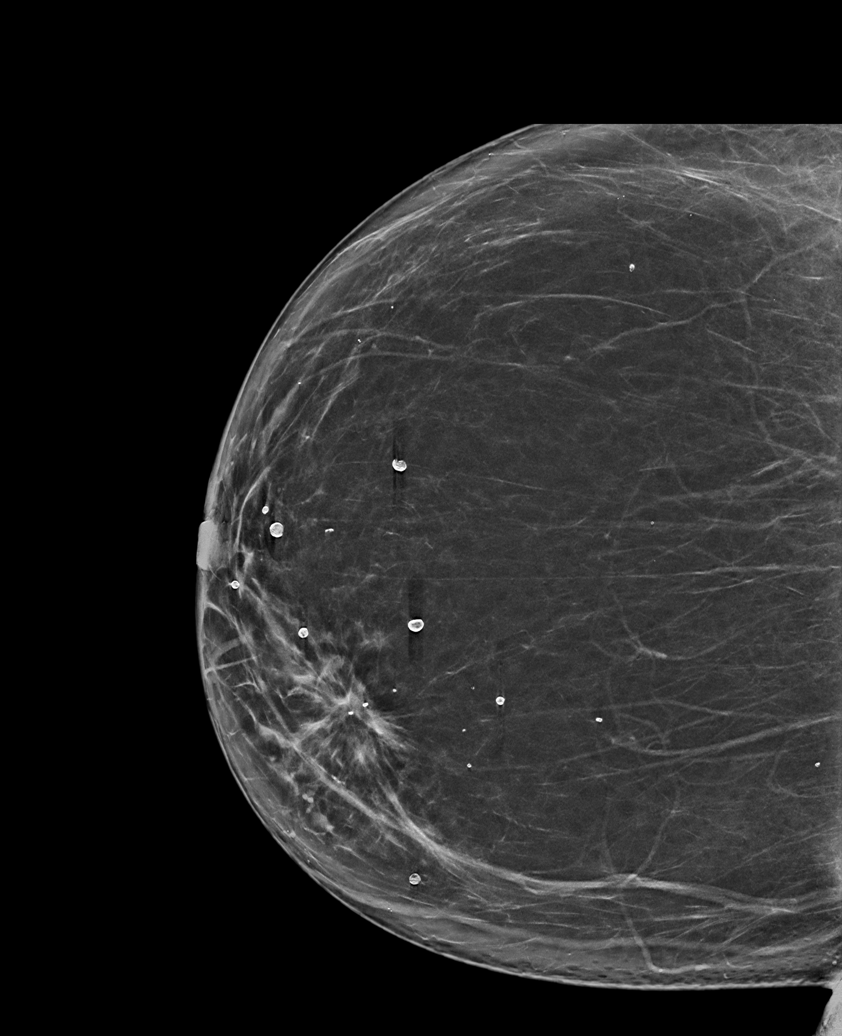

[R MLO synth-2D]
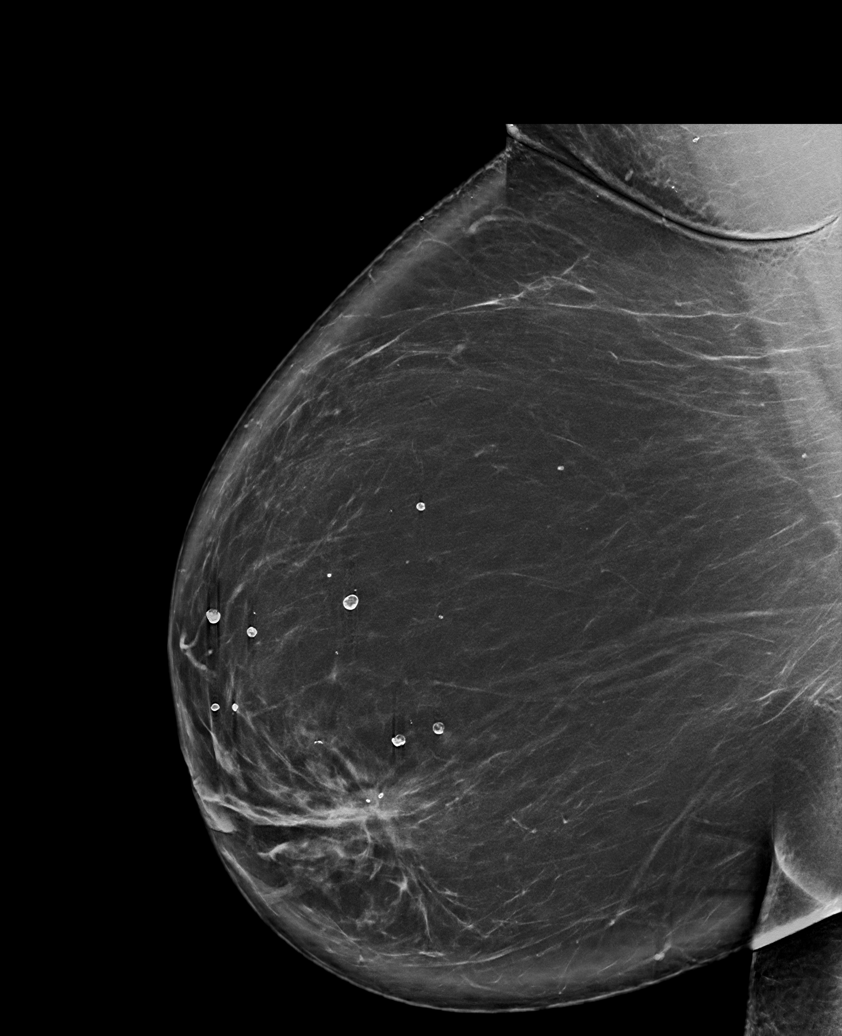

[L CC synth-2D (1 of 2)]
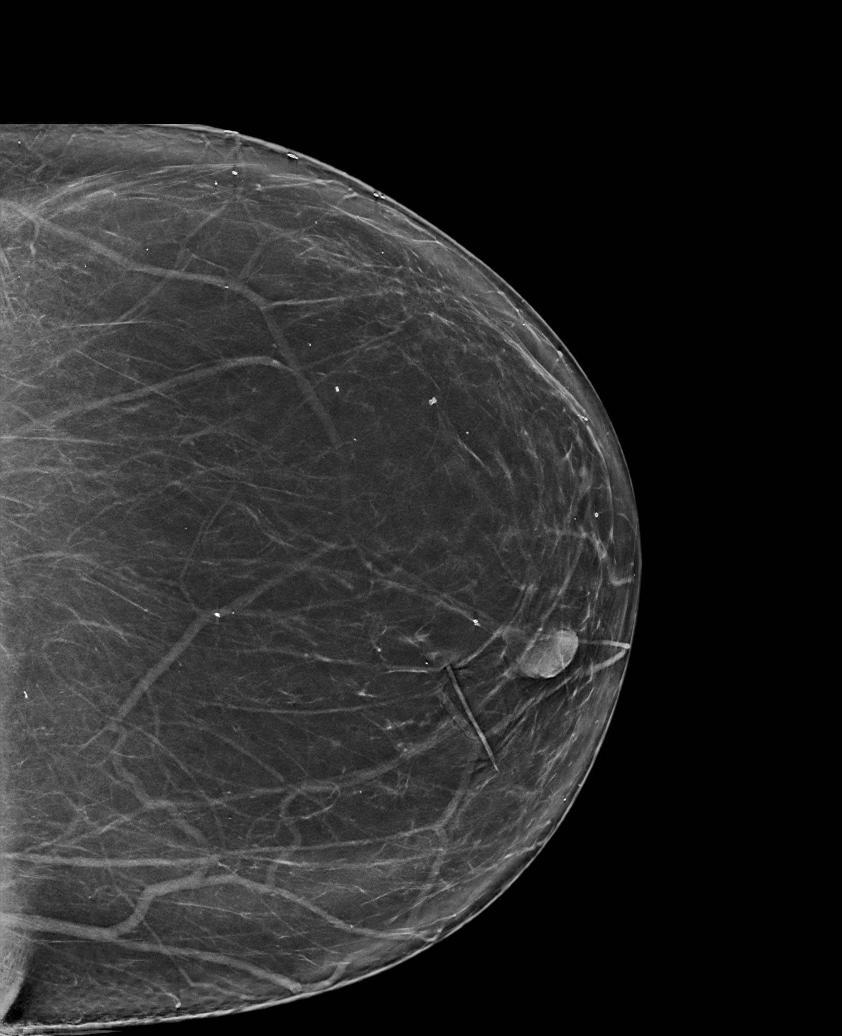

[L CC synth-2D (2 of 2)]
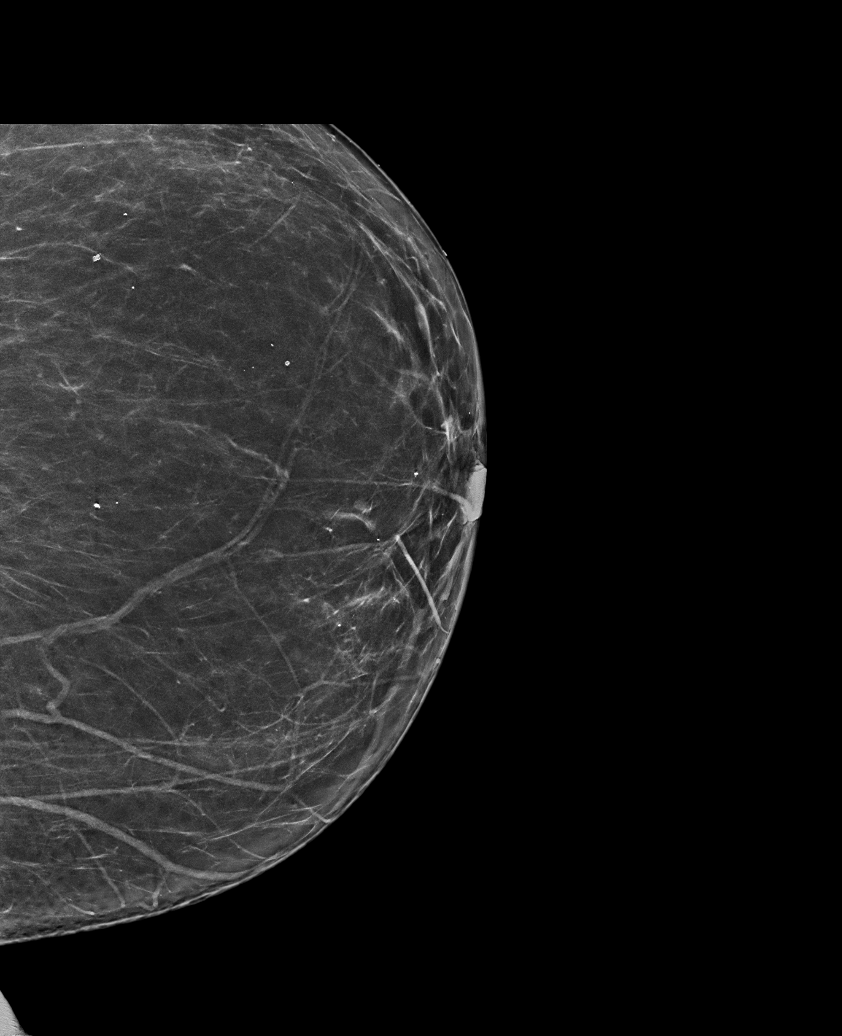

[L CC tomo · tomo slice 47/92.0]
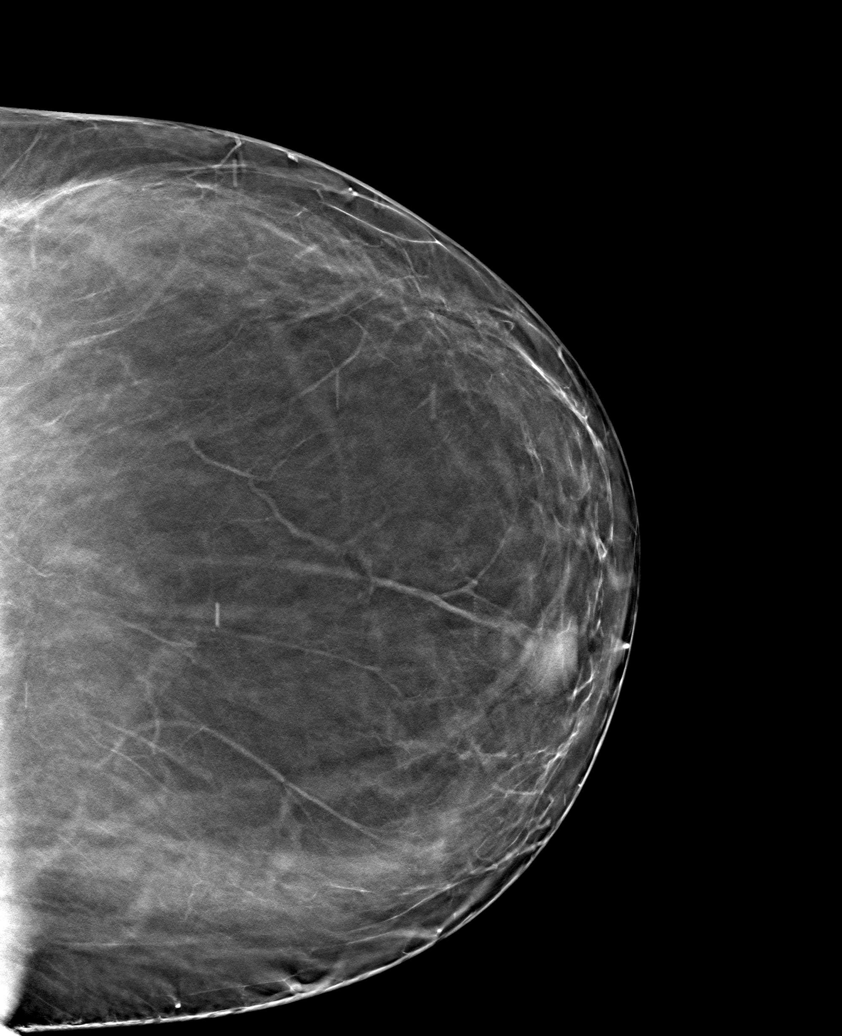

[6 of 30 positions shown; findings below may reference images not displayed]

FINDINGS: There are no findings suspicious for malignancy.
IMPRESSION: No mammographic evidence of malignancy. A result letter of this
screening mammogram will be mailed directly to the patient.

RECOMMENDATION:
Screening mammogram in one year. (Code:0E-3-N98)

BI-RADS CATEGORY  1: Negative.

## 2024-05-26 DIAGNOSIS — E113291 Type 2 diabetes mellitus with mild nonproliferative diabetic retinopathy without macular edema, right eye: Secondary | ICD-10-CM | POA: Diagnosis not present

## 2024-06-08 ENCOUNTER — Other Ambulatory Visit: Payer: Self-pay | Admitting: Nurse Practitioner

## 2024-06-08 DIAGNOSIS — Z1231 Encounter for screening mammogram for malignant neoplasm of breast: Secondary | ICD-10-CM

## 2024-06-17 ENCOUNTER — Ambulatory Visit
Admission: RE | Admit: 2024-06-17 | Discharge: 2024-06-17 | Disposition: A | Discharge status: Home / Self Care | Source: Ambulatory Visit | Attending: Nurse Practitioner | Admitting: Nurse Practitioner

## 2024-06-17 DIAGNOSIS — Z1231 Encounter for screening mammogram for malignant neoplasm of breast: Secondary | ICD-10-CM | POA: Diagnosis not present

## 2024-07-17 ENCOUNTER — Encounter: Payer: Self-pay | Admitting: Nurse Practitioner

## 2024-07-17 ENCOUNTER — Ambulatory Visit (INDEPENDENT_AMBULATORY_CARE_PROVIDER_SITE_OTHER): Admitting: Nurse Practitioner

## 2024-07-17 VITALS — BP 120/74 | HR 75 | Ht 68.75 in | Wt 268.0 lb

## 2024-07-17 DIAGNOSIS — Z780 Asymptomatic menopausal state: Secondary | ICD-10-CM | POA: Diagnosis not present

## 2024-07-17 DIAGNOSIS — Z1331 Encounter for screening for depression: Secondary | ICD-10-CM

## 2024-07-17 DIAGNOSIS — N393 Stress incontinence (female) (male): Secondary | ICD-10-CM | POA: Diagnosis not present

## 2024-07-17 DIAGNOSIS — Z01419 Encounter for gynecological examination (general) (routine) without abnormal findings: Secondary | ICD-10-CM

## 2024-07-17 NOTE — Progress Notes (Signed)
 Mercedes Morton 1966/04/12 [id number]   History:  58 y.o. G1P0010 presents for annual exam. Complains of urinary leakage with coughing, laughing, going from sitting to standing. Typically is just a dribble. Denies urge incontinence but does have occasional frequency. Postmenopausal - no HRT, no bleeding. Normal pap history. History of DM, HTN. Down 17 pounds since last visit Jan 2024.  Gynecologic History Patient's last menstrual period was 01/12/2019.   Contraception/Family planning: post menopausal status Sexually active: yes  Health Maintenance Last Pap: 09/13/2022. Results were: Normal neg HPV Last mammogram: 06/17/2024. Results were: Normal Last colonoscopy: 2016. Results were: Normal, 10-year recall Last Dexa: Not indicated     07/17/2024   11:32 AM  Depression screen PHQ 2/9  Decreased Interest 1  Down, Depressed, Hopeless 0  PHQ - 2 Score 1     Past medical history, past surgical history, family history and social history were all reviewed and documented in the EPIC chart. 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 Sister diagnosed with breast cancer at age 85.   ROS:  A ROS was performed and pertinent positives and negatives are included.  Exam:  Vitals:   07/17/24 1133  BP: 120/74  Pulse: 75  SpO2: 98%  Weight: 268 lb (121.6 kg)  Height: 5' 8.75 (1.746 m)     Body mass index is 39.87 kg/m.  General appearance:  Normal Thyroid:  Symmetrical, normal in size, without palpable masses or nodularity. Respiratory  Auscultation:  Clear without wheezing or rhonchi Cardiovascular  Auscultation:  Regular rate, without rubs, murmurs or gallops  Edema/varicosities:  Not grossly evident Abdominal  Soft,nontender, without masses, guarding or rebound.  Liver/spleen:  No organomegaly noted  Hernia:  None appreciated  Skin  Inspection:  Grossly normal Breasts: Examined lying and sitting.   Right: Without masses, retractions, nipple discharge or axillary  adenopathy.   Left: Without masses, retractions, nipple discharge or axillary adenopathy. Pelvic: External genitalia:  no lesions              Urethra:  normal appearing urethra with no masses, tenderness or lesions              Bartholins and Skenes: normal                 Vagina: normal appearing vagina with normal color and discharge, no lesions. Grade 1 cystocele.              Cervix: no lesions Bimanual Exam:  Uterus:  no masses or tenderness              Adnexa: no mass, fullness, tenderness              Rectovaginal: Deferred              Anus:  normal, no lesions  Mercedes Morton present as chaperone.   Assessment/Plan:  58 y.o. G1P0010 for annual exam.   Well female exam with routine gynecological exam - Education provided on SBEs, importance of preventative screenings, current guidelines, high calcium diet, regular exercise, and multivitamin daily. Labs with PCP.   Postmenopausal - no HRT, no bleeding.   Depression screening - PHQ - 1  Stress incontinence - Complains of urinary leakage with coughing, laughing, going from sitting to standing. Typically is just a dribble. Denies urge incontinence but does have occasional frequency. Discussed management options with lifestyle changes, weight loss, PFPT. Will consider PFPT. Mild cystocele.  Screening for cervical cancer - Normal Pap history. Will repeat at 5-year  interval per guidelines.   Screening for breast cancer - Normal mammogram history.  Continue annual screenings.  Normal breast exam today.  Screening for colon cancer - 2016 colonoscopy. Will repeat at GI's recommended interval.   Screening for osteoporosis - Average risk. Will plan DXA at age 66.   Return in about 1 year (around 07/17/2025) for Annual.     Mercedes DELENA Shutter DNP, 11:52 AM 07/17/2024

## 2024-08-10 DIAGNOSIS — E785 Hyperlipidemia, unspecified: Secondary | ICD-10-CM | POA: Diagnosis not present

## 2024-08-10 DIAGNOSIS — I10 Essential (primary) hypertension: Secondary | ICD-10-CM | POA: Diagnosis not present

## 2024-08-10 DIAGNOSIS — Z0000 Encounter for general adult medical examination without abnormal findings: Secondary | ICD-10-CM | POA: Diagnosis not present

## 2024-08-10 DIAGNOSIS — Z23 Encounter for immunization: Secondary | ICD-10-CM | POA: Diagnosis not present

## 2024-08-10 DIAGNOSIS — J309 Allergic rhinitis, unspecified: Secondary | ICD-10-CM | POA: Diagnosis not present

## 2024-08-10 DIAGNOSIS — E11319 Type 2 diabetes mellitus with unspecified diabetic retinopathy without macular edema: Secondary | ICD-10-CM | POA: Diagnosis not present

## 2024-08-10 DIAGNOSIS — Z6841 Body Mass Index (BMI) 40.0 and over, adult: Secondary | ICD-10-CM | POA: Diagnosis not present
